# Patient Record
Sex: Male | Born: 1950 | Race: White | Hispanic: No | Smoking: Never smoker
Health system: Southern US, Community
[De-identification: ages and names within clinical notes are randomized; demographics above are authoritative.]

## PROBLEM LIST (undated history)

## (undated) DIAGNOSIS — M1990 Unspecified osteoarthritis, unspecified site: Secondary | ICD-10-CM

## (undated) DIAGNOSIS — K219 Gastro-esophageal reflux disease without esophagitis: Secondary | ICD-10-CM

## (undated) DIAGNOSIS — E785 Hyperlipidemia, unspecified: Secondary | ICD-10-CM

## (undated) DIAGNOSIS — N189 Chronic kidney disease, unspecified: Secondary | ICD-10-CM

## (undated) DIAGNOSIS — I639 Cerebral infarction, unspecified: Secondary | ICD-10-CM

## (undated) DIAGNOSIS — E119 Type 2 diabetes mellitus without complications: Secondary | ICD-10-CM

## (undated) DIAGNOSIS — I10 Essential (primary) hypertension: Secondary | ICD-10-CM

## (undated) DIAGNOSIS — I2510 Atherosclerotic heart disease of native coronary artery without angina pectoris: Secondary | ICD-10-CM

## (undated) DIAGNOSIS — C801 Malignant (primary) neoplasm, unspecified: Secondary | ICD-10-CM

## (undated) HISTORY — PX: CYSTECTOMY: SUR359

## (undated) HISTORY — DX: Cerebral infarction, unspecified: I63.9

## (undated) HISTORY — PX: MOUTH SURGERY: SHX715

## (undated) HISTORY — DX: Malignant (primary) neoplasm, unspecified: C80.1

## (undated) HISTORY — DX: Essential (primary) hypertension: I10

## (undated) HISTORY — PX: ADENOIDECTOMY: SUR15

## (undated) HISTORY — PX: BICEPS TENDON REPAIR: SHX566

## (undated) HISTORY — PX: HERNIA REPAIR: SHX51

## (undated) HISTORY — DX: Hyperlipidemia, unspecified: E78.5

## (undated) HISTORY — DX: Type 2 diabetes mellitus without complications: E11.9

## (undated) HISTORY — PX: TONSILLECTOMY: SUR1361

## (undated) HISTORY — PX: SHOULDER ARTHROSCOPY: SHX128

## (undated) HISTORY — PX: KNEE ARTHROSCOPY: SUR90

## (undated) HISTORY — PX: ROTATOR CUFF REPAIR: SHX139

## (undated) HISTORY — DX: Atherosclerotic heart disease of native coronary artery without angina pectoris: I25.10

## (undated) HISTORY — DX: Unspecified osteoarthritis, unspecified site: M19.90

## (undated) HISTORY — DX: Gastro-esophageal reflux disease without esophagitis: K21.9

## (undated) HISTORY — PX: VASECTOMY: SHX75

## (undated) HISTORY — DX: Chronic kidney disease, unspecified: N18.9

---

## 2015-08-20 DIAGNOSIS — M47816 Spondylosis without myelopathy or radiculopathy, lumbar region: Secondary | ICD-10-CM | POA: Diagnosis not present

## 2015-08-20 DIAGNOSIS — Z79899 Other long term (current) drug therapy: Secondary | ICD-10-CM | POA: Diagnosis not present

## 2015-08-20 DIAGNOSIS — Z01818 Encounter for other preprocedural examination: Secondary | ICD-10-CM | POA: Diagnosis not present

## 2015-08-20 DIAGNOSIS — M4316 Spondylolisthesis, lumbar region: Secondary | ICD-10-CM | POA: Diagnosis not present

## 2015-08-20 DIAGNOSIS — M79609 Pain in unspecified limb: Secondary | ICD-10-CM | POA: Diagnosis not present

## 2015-08-20 DIAGNOSIS — Z01812 Encounter for preprocedural laboratory examination: Secondary | ICD-10-CM | POA: Diagnosis not present

## 2015-08-20 DIAGNOSIS — Z01810 Encounter for preprocedural cardiovascular examination: Secondary | ICD-10-CM | POA: Diagnosis not present

## 2015-08-20 DIAGNOSIS — M47896 Other spondylosis, lumbar region: Secondary | ICD-10-CM | POA: Diagnosis not present

## 2015-08-24 DIAGNOSIS — E119 Type 2 diabetes mellitus without complications: Secondary | ICD-10-CM | POA: Diagnosis not present

## 2015-08-25 DIAGNOSIS — M4316 Spondylolisthesis, lumbar region: Secondary | ICD-10-CM | POA: Diagnosis not present

## 2015-08-25 DIAGNOSIS — M5127 Other intervertebral disc displacement, lumbosacral region: Secondary | ICD-10-CM | POA: Diagnosis not present

## 2015-08-27 DIAGNOSIS — E1143 Type 2 diabetes mellitus with diabetic autonomic (poly)neuropathy: Secondary | ICD-10-CM | POA: Diagnosis not present

## 2015-08-27 DIAGNOSIS — M519 Unspecified thoracic, thoracolumbar and lumbosacral intervertebral disc disorder: Secondary | ICD-10-CM | POA: Diagnosis not present

## 2015-08-27 DIAGNOSIS — Z23 Encounter for immunization: Secondary | ICD-10-CM | POA: Diagnosis not present

## 2015-09-03 DIAGNOSIS — E785 Hyperlipidemia, unspecified: Secondary | ICD-10-CM | POA: Insufficient documentation

## 2015-09-03 DIAGNOSIS — I2510 Atherosclerotic heart disease of native coronary artery without angina pectoris: Secondary | ICD-10-CM | POA: Diagnosis not present

## 2015-09-03 DIAGNOSIS — I10 Essential (primary) hypertension: Secondary | ICD-10-CM | POA: Insufficient documentation

## 2015-09-03 DIAGNOSIS — E119 Type 2 diabetes mellitus without complications: Secondary | ICD-10-CM | POA: Diagnosis not present

## 2015-09-10 DIAGNOSIS — M549 Dorsalgia, unspecified: Secondary | ICD-10-CM | POA: Diagnosis not present

## 2015-10-02 DIAGNOSIS — M5431 Sciatica, right side: Secondary | ICD-10-CM | POA: Diagnosis not present

## 2015-10-02 DIAGNOSIS — M4316 Spondylolisthesis, lumbar region: Secondary | ICD-10-CM | POA: Diagnosis not present

## 2015-10-02 DIAGNOSIS — M4726 Other spondylosis with radiculopathy, lumbar region: Secondary | ICD-10-CM | POA: Diagnosis not present

## 2015-10-02 DIAGNOSIS — M4806 Spinal stenosis, lumbar region: Secondary | ICD-10-CM | POA: Diagnosis not present

## 2015-10-29 DIAGNOSIS — S161XXA Strain of muscle, fascia and tendon at neck level, initial encounter: Secondary | ICD-10-CM | POA: Diagnosis not present

## 2015-10-29 DIAGNOSIS — R69 Illness, unspecified: Secondary | ICD-10-CM | POA: Diagnosis not present

## 2015-10-29 DIAGNOSIS — I2510 Atherosclerotic heart disease of native coronary artery without angina pectoris: Secondary | ICD-10-CM | POA: Diagnosis not present

## 2015-10-29 DIAGNOSIS — M542 Cervicalgia: Secondary | ICD-10-CM | POA: Diagnosis not present

## 2015-10-29 DIAGNOSIS — I10 Essential (primary) hypertension: Secondary | ICD-10-CM | POA: Diagnosis not present

## 2015-10-29 DIAGNOSIS — K219 Gastro-esophageal reflux disease without esophagitis: Secondary | ICD-10-CM | POA: Diagnosis not present

## 2015-10-29 DIAGNOSIS — R51 Headache: Secondary | ICD-10-CM | POA: Diagnosis not present

## 2015-10-29 DIAGNOSIS — S199XXA Unspecified injury of neck, initial encounter: Secondary | ICD-10-CM | POA: Diagnosis not present

## 2015-10-29 DIAGNOSIS — D649 Anemia, unspecified: Secondary | ICD-10-CM | POA: Diagnosis not present

## 2015-10-29 DIAGNOSIS — E7800 Pure hypercholesterolemia, unspecified: Secondary | ICD-10-CM | POA: Diagnosis not present

## 2015-10-29 DIAGNOSIS — E119 Type 2 diabetes mellitus without complications: Secondary | ICD-10-CM | POA: Diagnosis not present

## 2015-10-29 DIAGNOSIS — S060X0A Concussion without loss of consciousness, initial encounter: Secondary | ICD-10-CM | POA: Diagnosis not present

## 2015-11-16 DIAGNOSIS — E119 Type 2 diabetes mellitus without complications: Secondary | ICD-10-CM | POA: Diagnosis not present

## 2015-11-24 DIAGNOSIS — E1143 Type 2 diabetes mellitus with diabetic autonomic (poly)neuropathy: Secondary | ICD-10-CM | POA: Diagnosis not present

## 2015-11-24 DIAGNOSIS — E1165 Type 2 diabetes mellitus with hyperglycemia: Secondary | ICD-10-CM | POA: Diagnosis not present

## 2015-12-10 DIAGNOSIS — I10 Essential (primary) hypertension: Secondary | ICD-10-CM | POA: Diagnosis not present

## 2015-12-10 DIAGNOSIS — I2510 Atherosclerotic heart disease of native coronary artery without angina pectoris: Secondary | ICD-10-CM | POA: Diagnosis not present

## 2015-12-14 DIAGNOSIS — I129 Hypertensive chronic kidney disease with stage 1 through stage 4 chronic kidney disease, or unspecified chronic kidney disease: Secondary | ICD-10-CM | POA: Diagnosis not present

## 2015-12-14 DIAGNOSIS — D649 Anemia, unspecified: Secondary | ICD-10-CM | POA: Diagnosis not present

## 2015-12-14 DIAGNOSIS — N183 Chronic kidney disease, stage 3 (moderate): Secondary | ICD-10-CM | POA: Diagnosis not present

## 2015-12-14 DIAGNOSIS — E1122 Type 2 diabetes mellitus with diabetic chronic kidney disease: Secondary | ICD-10-CM | POA: Diagnosis not present

## 2015-12-18 DIAGNOSIS — E1122 Type 2 diabetes mellitus with diabetic chronic kidney disease: Secondary | ICD-10-CM | POA: Diagnosis not present

## 2015-12-18 DIAGNOSIS — D649 Anemia, unspecified: Secondary | ICD-10-CM | POA: Diagnosis not present

## 2015-12-18 DIAGNOSIS — N183 Chronic kidney disease, stage 3 (moderate): Secondary | ICD-10-CM | POA: Diagnosis not present

## 2015-12-18 DIAGNOSIS — I129 Hypertensive chronic kidney disease with stage 1 through stage 4 chronic kidney disease, or unspecified chronic kidney disease: Secondary | ICD-10-CM | POA: Diagnosis not present

## 2016-02-02 DIAGNOSIS — H5203 Hypermetropia, bilateral: Secondary | ICD-10-CM | POA: Diagnosis not present

## 2016-02-02 DIAGNOSIS — E119 Type 2 diabetes mellitus without complications: Secondary | ICD-10-CM | POA: Diagnosis not present

## 2016-02-02 DIAGNOSIS — H52209 Unspecified astigmatism, unspecified eye: Secondary | ICD-10-CM | POA: Diagnosis not present

## 2016-02-02 DIAGNOSIS — H524 Presbyopia: Secondary | ICD-10-CM | POA: Diagnosis not present

## 2016-02-02 DIAGNOSIS — Z0100 Encounter for examination of eyes and vision without abnormal findings: Secondary | ICD-10-CM | POA: Diagnosis not present

## 2016-02-17 DIAGNOSIS — D649 Anemia, unspecified: Secondary | ICD-10-CM | POA: Diagnosis not present

## 2016-02-17 DIAGNOSIS — N183 Chronic kidney disease, stage 3 (moderate): Secondary | ICD-10-CM | POA: Diagnosis not present

## 2016-02-17 DIAGNOSIS — I129 Hypertensive chronic kidney disease with stage 1 through stage 4 chronic kidney disease, or unspecified chronic kidney disease: Secondary | ICD-10-CM | POA: Diagnosis not present

## 2016-02-17 DIAGNOSIS — E1122 Type 2 diabetes mellitus with diabetic chronic kidney disease: Secondary | ICD-10-CM | POA: Diagnosis not present

## 2016-02-29 DIAGNOSIS — E1143 Type 2 diabetes mellitus with diabetic autonomic (poly)neuropathy: Secondary | ICD-10-CM | POA: Diagnosis not present

## 2016-02-29 DIAGNOSIS — Z9181 History of falling: Secondary | ICD-10-CM | POA: Diagnosis not present

## 2016-02-29 DIAGNOSIS — Z1389 Encounter for screening for other disorder: Secondary | ICD-10-CM | POA: Diagnosis not present

## 2016-02-29 DIAGNOSIS — R69 Illness, unspecified: Secondary | ICD-10-CM | POA: Diagnosis not present

## 2016-02-29 DIAGNOSIS — Z23 Encounter for immunization: Secondary | ICD-10-CM | POA: Diagnosis not present

## 2016-02-29 DIAGNOSIS — M519 Unspecified thoracic, thoracolumbar and lumbosacral intervertebral disc disorder: Secondary | ICD-10-CM | POA: Diagnosis not present

## 2016-02-29 DIAGNOSIS — D510 Vitamin B12 deficiency anemia due to intrinsic factor deficiency: Secondary | ICD-10-CM | POA: Diagnosis not present

## 2016-02-29 DIAGNOSIS — R5383 Other fatigue: Secondary | ICD-10-CM | POA: Diagnosis not present

## 2016-02-29 DIAGNOSIS — Z0000 Encounter for general adult medical examination without abnormal findings: Secondary | ICD-10-CM | POA: Diagnosis not present

## 2016-04-22 DIAGNOSIS — E1143 Type 2 diabetes mellitus with diabetic autonomic (poly)neuropathy: Secondary | ICD-10-CM | POA: Diagnosis not present

## 2016-05-25 DIAGNOSIS — Z794 Long term (current) use of insulin: Secondary | ICD-10-CM | POA: Diagnosis not present

## 2016-05-25 DIAGNOSIS — E7800 Pure hypercholesterolemia, unspecified: Secondary | ICD-10-CM | POA: Diagnosis not present

## 2016-05-25 DIAGNOSIS — K219 Gastro-esophageal reflux disease without esophagitis: Secondary | ICD-10-CM | POA: Diagnosis not present

## 2016-05-25 DIAGNOSIS — J4520 Mild intermittent asthma, uncomplicated: Secondary | ICD-10-CM | POA: Diagnosis not present

## 2016-05-25 DIAGNOSIS — E1143 Type 2 diabetes mellitus with diabetic autonomic (poly)neuropathy: Secondary | ICD-10-CM | POA: Diagnosis not present

## 2016-05-25 DIAGNOSIS — Z0000 Encounter for general adult medical examination without abnormal findings: Secondary | ICD-10-CM | POA: Diagnosis not present

## 2016-05-25 DIAGNOSIS — I69354 Hemiplegia and hemiparesis following cerebral infarction affecting left non-dominant side: Secondary | ICD-10-CM | POA: Diagnosis not present

## 2016-05-25 DIAGNOSIS — R69 Illness, unspecified: Secondary | ICD-10-CM | POA: Diagnosis not present

## 2016-05-25 DIAGNOSIS — I10 Essential (primary) hypertension: Secondary | ICD-10-CM | POA: Diagnosis not present

## 2016-06-29 DIAGNOSIS — H60509 Unspecified acute noninfective otitis externa, unspecified ear: Secondary | ICD-10-CM | POA: Diagnosis not present

## 2016-06-29 DIAGNOSIS — E1165 Type 2 diabetes mellitus with hyperglycemia: Secondary | ICD-10-CM | POA: Diagnosis not present

## 2016-07-11 DIAGNOSIS — D649 Anemia, unspecified: Secondary | ICD-10-CM | POA: Diagnosis not present

## 2016-07-11 DIAGNOSIS — I129 Hypertensive chronic kidney disease with stage 1 through stage 4 chronic kidney disease, or unspecified chronic kidney disease: Secondary | ICD-10-CM | POA: Diagnosis not present

## 2016-07-11 DIAGNOSIS — N183 Chronic kidney disease, stage 3 (moderate): Secondary | ICD-10-CM | POA: Diagnosis not present

## 2016-07-11 DIAGNOSIS — E1122 Type 2 diabetes mellitus with diabetic chronic kidney disease: Secondary | ICD-10-CM | POA: Diagnosis not present

## 2016-07-26 DIAGNOSIS — Z794 Long term (current) use of insulin: Secondary | ICD-10-CM | POA: Diagnosis not present

## 2016-07-26 DIAGNOSIS — E785 Hyperlipidemia, unspecified: Secondary | ICD-10-CM | POA: Diagnosis not present

## 2016-07-26 DIAGNOSIS — I2510 Atherosclerotic heart disease of native coronary artery without angina pectoris: Secondary | ICD-10-CM | POA: Diagnosis not present

## 2016-07-26 DIAGNOSIS — I10 Essential (primary) hypertension: Secondary | ICD-10-CM | POA: Diagnosis not present

## 2016-07-26 DIAGNOSIS — E119 Type 2 diabetes mellitus without complications: Secondary | ICD-10-CM | POA: Diagnosis not present

## 2016-08-31 DIAGNOSIS — Z794 Long term (current) use of insulin: Secondary | ICD-10-CM | POA: Diagnosis not present

## 2016-08-31 DIAGNOSIS — I209 Angina pectoris, unspecified: Secondary | ICD-10-CM | POA: Diagnosis not present

## 2016-08-31 DIAGNOSIS — E785 Hyperlipidemia, unspecified: Secondary | ICD-10-CM | POA: Diagnosis not present

## 2016-08-31 DIAGNOSIS — I2510 Atherosclerotic heart disease of native coronary artery without angina pectoris: Secondary | ICD-10-CM | POA: Diagnosis not present

## 2016-08-31 DIAGNOSIS — I10 Essential (primary) hypertension: Secondary | ICD-10-CM | POA: Diagnosis not present

## 2016-08-31 DIAGNOSIS — E119 Type 2 diabetes mellitus without complications: Secondary | ICD-10-CM | POA: Diagnosis not present

## 2016-09-16 DIAGNOSIS — R69 Illness, unspecified: Secondary | ICD-10-CM | POA: Diagnosis not present

## 2016-09-28 DIAGNOSIS — E1165 Type 2 diabetes mellitus with hyperglycemia: Secondary | ICD-10-CM | POA: Diagnosis not present

## 2016-10-03 DIAGNOSIS — R062 Wheezing: Secondary | ICD-10-CM | POA: Diagnosis not present

## 2016-10-03 DIAGNOSIS — J00 Acute nasopharyngitis [common cold]: Secondary | ICD-10-CM | POA: Diagnosis not present

## 2016-10-03 DIAGNOSIS — Z23 Encounter for immunization: Secondary | ICD-10-CM | POA: Diagnosis not present

## 2016-10-03 DIAGNOSIS — E1165 Type 2 diabetes mellitus with hyperglycemia: Secondary | ICD-10-CM | POA: Diagnosis not present

## 2016-10-05 DIAGNOSIS — R062 Wheezing: Secondary | ICD-10-CM | POA: Diagnosis not present

## 2016-11-15 DIAGNOSIS — R1310 Dysphagia, unspecified: Secondary | ICD-10-CM | POA: Diagnosis not present

## 2016-12-01 DIAGNOSIS — E119 Type 2 diabetes mellitus without complications: Secondary | ICD-10-CM | POA: Diagnosis not present

## 2016-12-01 DIAGNOSIS — E785 Hyperlipidemia, unspecified: Secondary | ICD-10-CM | POA: Diagnosis not present

## 2016-12-01 DIAGNOSIS — I10 Essential (primary) hypertension: Secondary | ICD-10-CM | POA: Diagnosis not present

## 2016-12-01 DIAGNOSIS — Z794 Long term (current) use of insulin: Secondary | ICD-10-CM | POA: Diagnosis not present

## 2016-12-01 DIAGNOSIS — I2510 Atherosclerotic heart disease of native coronary artery without angina pectoris: Secondary | ICD-10-CM | POA: Diagnosis not present

## 2016-12-26 DIAGNOSIS — E1165 Type 2 diabetes mellitus with hyperglycemia: Secondary | ICD-10-CM | POA: Diagnosis not present

## 2017-01-02 DIAGNOSIS — E1165 Type 2 diabetes mellitus with hyperglycemia: Secondary | ICD-10-CM | POA: Diagnosis not present

## 2017-01-06 DIAGNOSIS — R69 Illness, unspecified: Secondary | ICD-10-CM | POA: Diagnosis not present

## 2017-01-24 DIAGNOSIS — M130 Polyarthritis, unspecified: Secondary | ICD-10-CM | POA: Diagnosis not present

## 2017-01-24 DIAGNOSIS — Z7982 Long term (current) use of aspirin: Secondary | ICD-10-CM | POA: Diagnosis not present

## 2017-01-24 DIAGNOSIS — I10 Essential (primary) hypertension: Secondary | ICD-10-CM | POA: Diagnosis not present

## 2017-01-24 DIAGNOSIS — H9193 Unspecified hearing loss, bilateral: Secondary | ICD-10-CM | POA: Diagnosis not present

## 2017-01-24 DIAGNOSIS — Z8673 Personal history of transient ischemic attack (TIA), and cerebral infarction without residual deficits: Secondary | ICD-10-CM | POA: Diagnosis not present

## 2017-01-24 DIAGNOSIS — M6208 Separation of muscle (nontraumatic), other site: Secondary | ICD-10-CM | POA: Diagnosis not present

## 2017-01-24 DIAGNOSIS — E785 Hyperlipidemia, unspecified: Secondary | ICD-10-CM | POA: Diagnosis not present

## 2017-01-24 DIAGNOSIS — I209 Angina pectoris, unspecified: Secondary | ICD-10-CM | POA: Diagnosis not present

## 2017-01-24 DIAGNOSIS — K219 Gastro-esophageal reflux disease without esophagitis: Secondary | ICD-10-CM | POA: Diagnosis not present

## 2017-01-24 DIAGNOSIS — G4700 Insomnia, unspecified: Secondary | ICD-10-CM | POA: Diagnosis not present

## 2017-01-24 DIAGNOSIS — Z7984 Long term (current) use of oral hypoglycemic drugs: Secondary | ICD-10-CM | POA: Diagnosis not present

## 2017-01-24 DIAGNOSIS — Z87891 Personal history of nicotine dependence: Secondary | ICD-10-CM | POA: Diagnosis not present

## 2017-01-24 DIAGNOSIS — E669 Obesity, unspecified: Secondary | ICD-10-CM | POA: Diagnosis not present

## 2017-01-24 DIAGNOSIS — Z6836 Body mass index (BMI) 36.0-36.9, adult: Secondary | ICD-10-CM | POA: Diagnosis not present

## 2017-01-24 DIAGNOSIS — E1141 Type 2 diabetes mellitus with diabetic mononeuropathy: Secondary | ICD-10-CM | POA: Diagnosis not present

## 2017-01-24 DIAGNOSIS — Z0000 Encounter for general adult medical examination without abnormal findings: Secondary | ICD-10-CM | POA: Diagnosis not present

## 2017-02-14 DIAGNOSIS — E1122 Type 2 diabetes mellitus with diabetic chronic kidney disease: Secondary | ICD-10-CM | POA: Diagnosis not present

## 2017-02-14 DIAGNOSIS — D649 Anemia, unspecified: Secondary | ICD-10-CM | POA: Diagnosis not present

## 2017-02-14 DIAGNOSIS — I129 Hypertensive chronic kidney disease with stage 1 through stage 4 chronic kidney disease, or unspecified chronic kidney disease: Secondary | ICD-10-CM | POA: Diagnosis not present

## 2017-02-14 DIAGNOSIS — N183 Chronic kidney disease, stage 3 (moderate): Secondary | ICD-10-CM | POA: Diagnosis not present

## 2017-02-28 DIAGNOSIS — I10 Essential (primary) hypertension: Secondary | ICD-10-CM | POA: Diagnosis not present

## 2017-02-28 DIAGNOSIS — E119 Type 2 diabetes mellitus without complications: Secondary | ICD-10-CM | POA: Diagnosis not present

## 2017-02-28 DIAGNOSIS — I209 Angina pectoris, unspecified: Secondary | ICD-10-CM | POA: Diagnosis not present

## 2017-02-28 DIAGNOSIS — I2510 Atherosclerotic heart disease of native coronary artery without angina pectoris: Secondary | ICD-10-CM | POA: Diagnosis not present

## 2017-02-28 DIAGNOSIS — E785 Hyperlipidemia, unspecified: Secondary | ICD-10-CM | POA: Diagnosis not present

## 2017-03-25 DIAGNOSIS — E119 Type 2 diabetes mellitus without complications: Secondary | ICD-10-CM | POA: Diagnosis not present

## 2017-03-25 DIAGNOSIS — H524 Presbyopia: Secondary | ICD-10-CM | POA: Diagnosis not present

## 2017-03-25 DIAGNOSIS — H52209 Unspecified astigmatism, unspecified eye: Secondary | ICD-10-CM | POA: Diagnosis not present

## 2017-03-25 DIAGNOSIS — H5203 Hypermetropia, bilateral: Secondary | ICD-10-CM | POA: Diagnosis not present

## 2017-04-19 DIAGNOSIS — Z79899 Other long term (current) drug therapy: Secondary | ICD-10-CM | POA: Diagnosis not present

## 2017-04-19 DIAGNOSIS — D519 Vitamin B12 deficiency anemia, unspecified: Secondary | ICD-10-CM | POA: Diagnosis not present

## 2017-04-19 DIAGNOSIS — E1165 Type 2 diabetes mellitus with hyperglycemia: Secondary | ICD-10-CM | POA: Diagnosis not present

## 2017-04-19 DIAGNOSIS — Z0000 Encounter for general adult medical examination without abnormal findings: Secondary | ICD-10-CM | POA: Diagnosis not present

## 2017-04-19 DIAGNOSIS — Z9181 History of falling: Secondary | ICD-10-CM | POA: Diagnosis not present

## 2017-04-19 DIAGNOSIS — Z1389 Encounter for screening for other disorder: Secondary | ICD-10-CM | POA: Diagnosis not present

## 2017-04-19 DIAGNOSIS — Z6832 Body mass index (BMI) 32.0-32.9, adult: Secondary | ICD-10-CM | POA: Diagnosis not present

## 2017-04-24 DIAGNOSIS — R69 Illness, unspecified: Secondary | ICD-10-CM | POA: Diagnosis not present

## 2017-04-25 DIAGNOSIS — R69 Illness, unspecified: Secondary | ICD-10-CM | POA: Diagnosis not present

## 2017-05-10 DIAGNOSIS — M7742 Metatarsalgia, left foot: Secondary | ICD-10-CM | POA: Diagnosis not present

## 2017-05-10 DIAGNOSIS — E1142 Type 2 diabetes mellitus with diabetic polyneuropathy: Secondary | ICD-10-CM | POA: Insufficient documentation

## 2017-05-10 DIAGNOSIS — M7741 Metatarsalgia, right foot: Secondary | ICD-10-CM | POA: Diagnosis not present

## 2017-07-21 DIAGNOSIS — R69 Illness, unspecified: Secondary | ICD-10-CM | POA: Diagnosis not present

## 2017-08-07 DIAGNOSIS — Z23 Encounter for immunization: Secondary | ICD-10-CM | POA: Diagnosis not present

## 2017-08-07 DIAGNOSIS — N183 Chronic kidney disease, stage 3 (moderate): Secondary | ICD-10-CM | POA: Diagnosis not present

## 2017-08-07 DIAGNOSIS — Z6833 Body mass index (BMI) 33.0-33.9, adult: Secondary | ICD-10-CM | POA: Diagnosis not present

## 2017-08-07 DIAGNOSIS — D649 Anemia, unspecified: Secondary | ICD-10-CM | POA: Diagnosis not present

## 2017-08-07 DIAGNOSIS — I129 Hypertensive chronic kidney disease with stage 1 through stage 4 chronic kidney disease, or unspecified chronic kidney disease: Secondary | ICD-10-CM | POA: Diagnosis not present

## 2017-08-07 DIAGNOSIS — E1122 Type 2 diabetes mellitus with diabetic chronic kidney disease: Secondary | ICD-10-CM | POA: Diagnosis not present

## 2017-08-09 ENCOUNTER — Encounter: Payer: Self-pay | Admitting: Cardiology

## 2017-08-09 ENCOUNTER — Ambulatory Visit (INDEPENDENT_AMBULATORY_CARE_PROVIDER_SITE_OTHER): Payer: Medicare HMO | Admitting: Cardiology

## 2017-08-09 VITALS — BP 140/62 | HR 80 | Resp 12 | Ht 68.0 in | Wt 220.8 lb

## 2017-08-09 DIAGNOSIS — I10 Essential (primary) hypertension: Secondary | ICD-10-CM | POA: Diagnosis not present

## 2017-08-09 DIAGNOSIS — E1142 Type 2 diabetes mellitus with diabetic polyneuropathy: Secondary | ICD-10-CM | POA: Diagnosis not present

## 2017-08-09 DIAGNOSIS — I2510 Atherosclerotic heart disease of native coronary artery without angina pectoris: Secondary | ICD-10-CM

## 2017-08-09 DIAGNOSIS — E785 Hyperlipidemia, unspecified: Secondary | ICD-10-CM

## 2017-08-09 DIAGNOSIS — I209 Angina pectoris, unspecified: Secondary | ICD-10-CM

## 2017-08-09 MED ORDER — ISOSORBIDE MONONITRATE ER 30 MG PO TB24: 30.0000 mg | ORAL_TABLET | Freq: Every day | ORAL | 6 refills | Status: DC

## 2017-08-09 NOTE — Patient Instructions (Addendum)
Medication Instructions:  Your physician has recommended you make the following change in your medication:  1.) START taking isosorbide (IMDUR) 30 mg daily.   Labwork: None   Testing/Procedures: EKG today in office.   Follow-Up: Your physician recommends that you schedule a follow-up appointment in: 4 months   Any Other Special Instructions Will Be Listed Below (If Applicable).  Please note that any paperwork needing to be filled out by the provider will need to be addressed at the front desk prior to seeing the provider. Please note that any paperwork FMLA, Disability or other documents regarding health condition is subject to a $25.00 charge that must be received prior to completion of paperwork in the form of a money order or check.    If you need a refill on your cardiac medications before your next appointment, please call your pharmacy.

## 2017-08-09 NOTE — Progress Notes (Signed)
Cardiology Office Note:    Date:  08/09/2017   ID:  Nathan Sloan, DOB 31-Oct-1951, MRN [id number]  PCP:  Angelina Sheriff, MD  Cardiologist:  Jenne Campus, MD    Referring MD: Angelina Sheriff, MD   Chief Complaint  Patient presents with  . Follow-up  I'm doing well  History of Present Illness:    Nathan Sloan is a 66 y.o. male  with coronary artery disease. He does have stable angina. Successfully managed with ranolazine but still reported to have rare episodes of chest pain. He said it happens maybe every 2 or 3 weeks. Typically with extreme exertion. Still he is fairly active and maintain activities of daily living with no difficulties. Normal activity does not bring pain  Past Medical History:  Diagnosis Date  . Chronic kidney disease   . Coronary atherosclerosis of native coronary artery   . Diabetes mellitus without complication ([place])   . GERD (gastroesophageal reflux disease)   . Hyperlipidemia   . Hypertension     Past Surgical History:  Procedure Laterality Date  . CYSTECTOMY    . HERNIA REPAIR    . TONSILLECTOMY      Current Medications: Current Meds  Medication Sig  . amLODipine (NORVASC) 10 MG tablet Take 10 mg by mouth daily.  Marland Kitchen aspirin (GOODSENSE ASPIRIN) 325 MG tablet Take 165 mg by mouth daily.  Marland Kitchen atorvastatin (LIPITOR) 10 MG tablet Take 20 mg by mouth daily.  . cloNIDine (CATAPRES) 0.1 MG tablet Take 0.1 mg by mouth 2 (two) times daily.  . Ferrous Sulfate (IRON) 28 MG TABS Take 1 tablet by mouth daily.  Marland Kitchen gabapentin (NEURONTIN) 300 MG capsule Take 300 mg by mouth daily.   Marland Kitchen glimepiride (AMARYL) 4 MG tablet Take 4 mg by mouth. 1 tablet in the morning and 1/2 in the afternoon  . insulin NPH-regular Human (NOVOLIN 70/30) (70-30) 100 UNIT/ML injection   . Multiple Vitamin (MULTIVITAMIN) capsule Take 1 capsule by mouth daily.  . nitroGLYCERIN (NITROSTAT) 0.4 MG SL tablet Place 0.4 mg under the tongue as needed for chest pain.  . Omega-3 Fatty  Acids (FISH OIL) 1000 MG CAPS Take 1,000 mg by mouth 2 (two) times daily.  . pantoprazole (PROTONIX) 40 MG tablet Take 40 mg by mouth 2 (two) times daily.  . ranolazine (RANEXA) 500 MG 12 hr tablet Take 500 mg by mouth 2 (two) times daily.  Marland Kitchen telmisartan-hydrochlorothiazide (MICARDIS HCT) 80-25 MG tablet Take 1 tablet by mouth daily.  . traZODone (DESYREL) 50 MG tablet Take 50 mg by mouth daily.  . vitamin B-12 (CYANOCOBALAMIN) 500 MCG tablet Take 1 tablet by mouth daily.  . vitamin C (ASCORBIC ACID) 500 MG tablet Take 500 mg by mouth daily.     Allergies:   Sulfa antibiotics; Escitalopram oxalate; Hydrochlorothiazide; Ibuprofen; and Lisinopril   Social History   Social History  . Marital status: Married    Spouse name: N/A  . Number of children: N/A  . Years of education: N/A   Social History Main Topics  . Smoking status: Never Smoker  . Smokeless tobacco: Never Used  . Alcohol use No  . Drug use: No  . Sexual activity: Not Asked   Other Topics Concern  . None   Social History Narrative  . None     Family History: The patient's family history includes Breast cancer in his mother and sister; Colon cancer in his sister. ROS:   Please see the history of present illness.  All 14 point review of systems negative except as described per history of present illness  EKGs/Labs/Other Studies Reviewed:      Recent Labs: No results found for requested labs within last 8760 hours.  Recent Lipid Panel No results found for: CHOL, TRIG, HDL, CHOLHDL, VLDL, LDLCALC, LDLDIRECT  Physical Exam:    VS:  BP 140/62   Pulse 80   Resp 12   Ht 5\' 8"  (1.727 m)   Wt 220 lb 12.8 oz (100.2 kg)   BMI 33.57 kg/m     Wt Readings from Last 3 Encounters:  08/09/17 220 lb 12.8 oz (100.2 kg)     GEN:  Well nourished, well developed in no acute distress HEENT: Normal NECK: No JVD; No carotid bruits LYMPHATICS: No lymphadenopathy CARDIAC: RRR, no murmurs, no rubs, no  gallops RESPIRATORY:  Clear to auscultation without rales, wheezing or rhonchi  ABDOMEN: Soft, non-tender, non-distended MUSCULOSKELETAL:  No edema; No deformity  SKIN: Warm and dry LOWER EXTREMITIES: no swelling NEUROLOGIC:  Alert and oriented x 3 PSYCHIATRIC:  Normal affect   ASSESSMENT:    1. Coronary artery disease involving native coronary artery of native heart without angina pectoris   2. Angina pectoris ([place])   3. Essential hypertension   4. Diabetic peripheral neuropathy ([place])   5. Dyslipidemia    PLAN:    In order of problems listed above:  1. Coronary artery disease: Stable angina pectoris. I will give him prescription for Imdur 30 mg daily hoping to complete the suppress the pain he is taking nitroglycerin rarely when necessary a total me frequency increased induration increase he needs to let us know. 2. Angina pectoris: Plan as outlined above. 3. Essential hypertension: Blood pressure well controlled we'll continue present management. 4. Diabetes: Stable he brought his sugars and was excellent. 5. Dyslipidemia: I will call primary care physician to get fasting lipid profile.  Overall Tabari is doing well. We'll do EKG. I see him back in my office in 4-5 months.   Medication Adjustments/Labs and Tests Ordered: Current medicines are reviewed at length with the patient today.  Concerns regarding medicines are outlined above.  No orders of the defined types were placed in this encounter.  Medication changes: No orders of the defined types were placed in this encounter.   Signed, Park Liter, MD, [facility] 08/09/2017 2:39 PM    [place]

## 2017-11-08 DIAGNOSIS — E1143 Type 2 diabetes mellitus with diabetic autonomic (poly)neuropathy: Secondary | ICD-10-CM | POA: Diagnosis not present

## 2017-11-08 DIAGNOSIS — Z6834 Body mass index (BMI) 34.0-34.9, adult: Secondary | ICD-10-CM | POA: Diagnosis not present

## 2017-11-08 DIAGNOSIS — G4700 Insomnia, unspecified: Secondary | ICD-10-CM | POA: Diagnosis not present

## 2017-12-04 DIAGNOSIS — Z23 Encounter for immunization: Secondary | ICD-10-CM | POA: Diagnosis not present

## 2018-01-03 DIAGNOSIS — Z23 Encounter for immunization: Secondary | ICD-10-CM | POA: Diagnosis not present

## 2018-01-15 ENCOUNTER — Telehealth: Payer: Self-pay | Admitting: Cardiology

## 2018-01-15 MED ORDER — NITROGLYCERIN 0.4 MG SL SUBL: 0.4000 mg | SUBLINGUAL_TABLET | SUBLINGUAL | 7 refills | Status: DC | PRN

## 2018-01-15 NOTE — Telephone Encounter (Signed)
Refill sent.

## 2018-01-15 NOTE — Telephone Encounter (Signed)
°*  STAT* If patient is at the pharmacy, call can be transferred to refill team.   1. Which medications need to be refilled? (please list name of each medication and dose if known) Nitroglycerin  2. Which pharmacy/location (including street and city if local pharmacy) is medication to be sent to? [facility] In [place]  3. Do they need a 30 day or 90 day supply? Normal amount   Prescription had expired for Nitro.

## 2018-02-01 DIAGNOSIS — R69 Illness, unspecified: Secondary | ICD-10-CM | POA: Diagnosis not present

## 2018-02-05 DIAGNOSIS — D638 Anemia in other chronic diseases classified elsewhere: Secondary | ICD-10-CM | POA: Diagnosis not present

## 2018-02-05 DIAGNOSIS — Z6833 Body mass index (BMI) 33.0-33.9, adult: Secondary | ICD-10-CM | POA: Diagnosis not present

## 2018-02-05 DIAGNOSIS — E1165 Type 2 diabetes mellitus with hyperglycemia: Secondary | ICD-10-CM | POA: Diagnosis not present

## 2018-02-07 ENCOUNTER — Ambulatory Visit: Payer: Medicare HMO | Admitting: Cardiology

## 2018-02-07 ENCOUNTER — Encounter: Payer: Self-pay | Admitting: *Deleted

## 2018-02-07 ENCOUNTER — Encounter: Payer: Self-pay | Admitting: Cardiology

## 2018-02-07 VITALS — BP 130/60 | HR 55 | Ht 68.0 in | Wt 224.0 lb

## 2018-02-07 DIAGNOSIS — E785 Hyperlipidemia, unspecified: Secondary | ICD-10-CM | POA: Diagnosis not present

## 2018-02-07 DIAGNOSIS — I209 Angina pectoris, unspecified: Secondary | ICD-10-CM | POA: Diagnosis not present

## 2018-02-07 DIAGNOSIS — I2510 Atherosclerotic heart disease of native coronary artery without angina pectoris: Secondary | ICD-10-CM | POA: Diagnosis not present

## 2018-02-07 DIAGNOSIS — I10 Essential (primary) hypertension: Secondary | ICD-10-CM | POA: Diagnosis not present

## 2018-02-07 NOTE — Patient Instructions (Signed)
Medication Instructions:  Your physician recommends that you continue on your current medications as directed. Please refer to the Current Medication list given to you today.  Labwork: None Ordered  Testing/Procedures: Your physician has requested that you have an echocardiogram. Echocardiography is a painless test that uses sound waves to create images of your heart. It provides your doctor with information about the size and shape of your heart and how well your heart's chambers and valves are working. This procedure takes approximately one hour. There are no restrictions for this procedure.  Follow-Up: Your physician recommends that you schedule a follow-up appointment in: 5 months with Dr. Agustin Cree   Any Other Special Instructions Will Be Listed Below (If Applicable).     If you need a refill on your cardiac medications before your next appointment, please call your pharmacy.

## 2018-02-07 NOTE — Progress Notes (Signed)
Cardiology Office Note:    Date:  02/07/2018   ID:  Nathan Sloan, DOB 04-21-1951, MRN [id number]  PCP:  Angelina Sheriff, MD  Cardiologist:  Jenne Campus, MD    Referring MD: Angelina Sheriff, MD   Chief Complaint  Patient presents with  . Follow-up  Well  History of Present Illness:    Nathan Sloan is a 67 y.o. male with coronary artery disease.  Overall he seems to be doing well.  Still very active have no difficulty doing it I gave him last time long-acting nitrates which gave him headache initially but then he got used to it and he thinks it helps a great deal again denies having any chest pain tightness squeezing pressure burning chest.  Monday he did see his primary care physician there was a lot of blood work done.  We will try to retrieve results of it.  Past Medical History:  Diagnosis Date  . Chronic kidney disease   . Coronary atherosclerosis of native coronary artery   . Diabetes mellitus without complication ([place])   . GERD (gastroesophageal reflux disease)   . Hyperlipidemia   . Hypertension     Past Surgical History:  Procedure Laterality Date  . CYSTECTOMY    . HERNIA REPAIR    . TONSILLECTOMY      Current Medications: Current Meds  Medication Sig  . amLODipine (NORVASC) 10 MG tablet Take 10 mg by mouth daily.  Marland Kitchen aspirin (GOODSENSE ASPIRIN) 325 MG tablet Take 165 mg by mouth daily.  Marland Kitchen atorvastatin (LIPITOR) 20 MG tablet Take 20 mg by mouth daily.  . cloNIDine (CATAPRES) 0.1 MG tablet Take 0.1 mg by mouth 2 (two) times daily.  . Ferrous Sulfate (IRON) 28 MG TABS Take 1 tablet by mouth daily.  Marland Kitchen gabapentin (NEURONTIN) 300 MG capsule Take 300 mg by mouth daily.   Marland Kitchen glimepiride (AMARYL) 4 MG tablet Take 4 mg by mouth. 1 tablet in the morning and 1 in the afternoon  . insulin NPH-regular Human (NOVOLIN 70/30) (70-30) 100 UNIT/ML injection   . isosorbide mononitrate (IMDUR) 30 MG 24 hr tablet Take 1 tablet (30 mg total) by mouth daily.  . Multiple  Vitamin (MULTIVITAMIN) capsule Take 1 capsule by mouth daily.  . nitroGLYCERIN (NITROSTAT) 0.4 MG SL tablet Place 1 tablet (0.4 mg total) under the tongue every 5 (five) minutes as needed for chest pain.  . Omega-3 Fatty Acids (FISH OIL) 1000 MG CAPS Take 1,000 mg by mouth 2 (two) times daily.  . pantoprazole (PROTONIX) 40 MG tablet Take 40 mg by mouth 2 (two) times daily.  . ranolazine (RANEXA) 500 MG 12 hr tablet Take 500 mg by mouth 2 (two) times daily.  Marland Kitchen telmisartan-hydrochlorothiazide (MICARDIS HCT) 80-25 MG tablet Take 1 tablet by mouth daily.  . traZODone (DESYREL) 50 MG tablet Take 50 mg by mouth daily.  . vitamin B-12 (CYANOCOBALAMIN) 500 MCG tablet Take 1 tablet by mouth daily.  . vitamin C (ASCORBIC ACID) 500 MG tablet Take 500 mg by mouth daily.     Allergies:   Sulfa antibiotics; Escitalopram oxalate; Hydrochlorothiazide; Ibuprofen; and Lisinopril   Social History   Socioeconomic History  . Marital status: Married    Spouse name: Not on file  . Number of children: Not on file  . Years of education: Not on file  . Highest education level: Not on file  Occupational History  . Not on file  Social Needs  . Financial resource strain: Not on  file  . Food insecurity:    Worry: Not on file    Inability: Not on file  . Transportation needs:    Medical: Not on file    Non-medical: Not on file  Tobacco Use  . Smoking status: Never Smoker  . Smokeless tobacco: Never Used  Substance and Sexual Activity  . Alcohol use: No  . Drug use: No  . Sexual activity: Not on file  Lifestyle  . Physical activity:    Days per week: Not on file    Minutes per session: Not on file  . Stress: Not on file  Relationships  . Social connections:    Talks on phone: Not on file    Gets together: Not on file    Attends religious service: Not on file    Active member of club or organization: Not on file    Attends meetings of clubs or organizations: Not on file    Relationship status: Not  on file  Other Topics Concern  . Not on file  Social History Narrative  . Not on file     Family History: The patient's family history includes Breast cancer in his mother and sister; Colon cancer in his sister. ROS:   Please see the history of present illness.    All 14 point review of systems negative except as described per history of present illness  EKGs/Labs/Other Studies Reviewed:      Recent Labs: No results found for requested labs within last 8760 hours.  Recent Lipid Panel No results found for: CHOL, TRIG, HDL, CHOLHDL, VLDL, LDLCALC, LDLDIRECT  Physical Exam:    VS:  BP 130/60   Pulse (!) 55   Ht 5\' 8"  (1.727 m)   Wt 224 lb (101.6 kg)   SpO2 95%   BMI 34.06 kg/m     Wt Readings from Last 3 Encounters:  02/07/18 224 lb (101.6 kg)  08/09/17 220 lb 12.8 oz (100.2 kg)     GEN:  Well nourished, well developed in no acute distress HEENT: Normal NECK: No JVD; No carotid bruits LYMPHATICS: No lymphadenopathy CARDIAC: RRR, no murmurs, no rubs, no gallops RESPIRATORY:  Clear to auscultation without rales, wheezing or rhonchi  ABDOMEN: Soft, non-tender, non-distended MUSCULOSKELETAL:  No edema; No deformity  SKIN: Warm and dry LOWER EXTREMITIES: Normal swelling NEUROLOGIC:  Alert and oriented x 3 PSYCHIATRIC:  Normal affect   ASSESSMENT:    1. Coronary artery disease involving native coronary artery of native heart without angina pectoris   2. Angina pectoris ([place])   3. Essential hypertension   4. Dyslipidemia    PLAN:    In order of problems listed above:  1. Coronary artery disease: Stable and appropriate medications which I will continue. 2. Angina pectoris: Controlled with medications which I will continue. 3. Essential hypertension doing well from that point of view he brought a book with blood pressure measurements that he does a few times a day and all are looking excellent. 4. Dyslipidemia he is on high intensity statin which I will continue we  will call primary care physician to get fasting lipid profile  He did not the swelling of lower extremities which appears to be worse at evening time and overall.  I will schedule him to have echocardiogram to assess left ventricular ejection fraction.   Medication Adjustments/Labs and Tests Ordered: Current medicines are reviewed at length with the patient today.  Concerns regarding medicines are outlined above.  No orders of the defined types  were placed in this encounter.  Medication changes: No orders of the defined types were placed in this encounter.   Signed, Park Liter, MD, [facility] 02/07/2018 10:26 AM    [place]

## 2018-03-01 DIAGNOSIS — E1122 Type 2 diabetes mellitus with diabetic chronic kidney disease: Secondary | ICD-10-CM | POA: Diagnosis not present

## 2018-03-01 DIAGNOSIS — D649 Anemia, unspecified: Secondary | ICD-10-CM | POA: Diagnosis not present

## 2018-03-01 DIAGNOSIS — Z6833 Body mass index (BMI) 33.0-33.9, adult: Secondary | ICD-10-CM | POA: Diagnosis not present

## 2018-03-01 DIAGNOSIS — I129 Hypertensive chronic kidney disease with stage 1 through stage 4 chronic kidney disease, or unspecified chronic kidney disease: Secondary | ICD-10-CM | POA: Diagnosis not present

## 2018-03-01 DIAGNOSIS — N183 Chronic kidney disease, stage 3 (moderate): Secondary | ICD-10-CM | POA: Diagnosis not present

## 2018-03-05 ENCOUNTER — Other Ambulatory Visit (HOSPITAL_BASED_OUTPATIENT_CLINIC_OR_DEPARTMENT_OTHER): Payer: Medicare HMO

## 2018-03-05 ENCOUNTER — Other Ambulatory Visit: Payer: Self-pay | Admitting: Cardiology

## 2018-03-05 DIAGNOSIS — I2510 Atherosclerotic heart disease of native coronary artery without angina pectoris: Secondary | ICD-10-CM

## 2018-03-05 DIAGNOSIS — I209 Angina pectoris, unspecified: Secondary | ICD-10-CM

## 2018-03-12 ENCOUNTER — Ambulatory Visit (HOSPITAL_BASED_OUTPATIENT_CLINIC_OR_DEPARTMENT_OTHER)
Admission: RE | Admit: 2018-03-12 | Discharge: 2018-03-12 | Disposition: A | Discharge status: Home / Self Care | Payer: Medicare HMO | Source: Ambulatory Visit | Attending: Cardiology | Admitting: Cardiology

## 2018-03-12 DIAGNOSIS — I2510 Atherosclerotic heart disease of native coronary artery without angina pectoris: Secondary | ICD-10-CM

## 2018-03-12 DIAGNOSIS — I10 Essential (primary) hypertension: Secondary | ICD-10-CM

## 2018-03-12 DIAGNOSIS — I25119 Atherosclerotic heart disease of native coronary artery with unspecified angina pectoris: Secondary | ICD-10-CM | POA: Insufficient documentation

## 2018-03-12 DIAGNOSIS — E785 Hyperlipidemia, unspecified: Secondary | ICD-10-CM | POA: Insufficient documentation

## 2018-03-12 DIAGNOSIS — I77810 Thoracic aortic ectasia: Secondary | ICD-10-CM | POA: Insufficient documentation

## 2018-03-12 DIAGNOSIS — I119 Hypertensive heart disease without heart failure: Secondary | ICD-10-CM | POA: Diagnosis not present

## 2018-03-12 DIAGNOSIS — I351 Nonrheumatic aortic (valve) insufficiency: Secondary | ICD-10-CM | POA: Insufficient documentation

## 2018-03-12 DIAGNOSIS — I209 Angina pectoris, unspecified: Secondary | ICD-10-CM | POA: Diagnosis not present

## 2018-03-12 NOTE — Progress Notes (Signed)
Echocardiogram 2D Echocardiogram has been performed.  Nathan Sloan 03/12/2018, 9:34 AM

## 2018-03-20 DIAGNOSIS — Z0100 Encounter for examination of eyes and vision without abnormal findings: Secondary | ICD-10-CM | POA: Diagnosis not present

## 2018-03-20 DIAGNOSIS — H524 Presbyopia: Secondary | ICD-10-CM | POA: Diagnosis not present

## 2018-03-20 DIAGNOSIS — H52209 Unspecified astigmatism, unspecified eye: Secondary | ICD-10-CM | POA: Diagnosis not present

## 2018-03-20 DIAGNOSIS — H5203 Hypermetropia, bilateral: Secondary | ICD-10-CM | POA: Diagnosis not present

## 2018-03-20 DIAGNOSIS — E119 Type 2 diabetes mellitus without complications: Secondary | ICD-10-CM | POA: Diagnosis not present

## 2018-03-21 ENCOUNTER — Telehealth: Payer: Self-pay | Admitting: *Deleted

## 2018-03-21 ENCOUNTER — Encounter: Payer: Self-pay | Admitting: *Deleted

## 2018-03-21 DIAGNOSIS — I7789 Other specified disorders of arteries and arterioles: Secondary | ICD-10-CM | POA: Insufficient documentation

## 2018-03-21 DIAGNOSIS — I10 Essential (primary) hypertension: Secondary | ICD-10-CM

## 2018-03-21 NOTE — Telephone Encounter (Signed)
Patient informed of echocardiogram results. Advised patient of further testing. Patient agreeable to have CT angio chest at [facility]. Patient informed that he would be contacted to schedule this appointment. Informed him of lab work that will need to be done 3-7 days before testing. Patient advised to go to [facility] for lab work. Will send reminder letter to patient via mail. Patient verbalized understanding. No further questions.

## 2018-03-21 NOTE — Telephone Encounter (Signed)
-----   Message from Park Liter, MD sent at 03/21/2018 12:04 PM EDT ----- Echocardiogram shows significant enlargement of the aortic root, ejection fraction was normal.  He will need CT angios of his chest to look at the aorta

## 2018-03-27 NOTE — Telephone Encounter (Signed)
Patient informed that CTA chest is scheduled on Thursday, 03/29/18 at 1:30 at [facility]. Patient advised to check in at the outpatient center at 12:30 so he can have lab work done there before hand versus going to [facility]. Patient verbalized understanding. No further questions. Faxed order form to [facility].

## 2018-03-29 DIAGNOSIS — I712 Thoracic aortic aneurysm, without rupture: Secondary | ICD-10-CM | POA: Diagnosis not present

## 2018-03-29 DIAGNOSIS — I7789 Other specified disorders of arteries and arterioles: Secondary | ICD-10-CM | POA: Diagnosis not present

## 2018-04-10 ENCOUNTER — Telehealth: Payer: Self-pay | Admitting: Cardiology

## 2018-04-10 NOTE — Telephone Encounter (Signed)
Left voicemail informing patient that Dr. Bettina Gavia will review testing and will call back.

## 2018-04-10 NOTE — Telephone Encounter (Signed)
Patient called last Friday for results but has not heard back.

## 2018-04-10 NOTE — Telephone Encounter (Signed)
Patient returned call for results 

## 2018-04-11 ENCOUNTER — Other Ambulatory Visit: Payer: Self-pay

## 2018-04-11 DIAGNOSIS — I7789 Other specified disorders of arteries and arterioles: Secondary | ICD-10-CM

## 2018-04-11 DIAGNOSIS — I2510 Atherosclerotic heart disease of native coronary artery without angina pectoris: Secondary | ICD-10-CM

## 2018-04-11 NOTE — Telephone Encounter (Signed)
Informed patient that we would wait for the CT to be reviewed by Dr. Agustin Cree once he returns. It was briefly reviewed by DOD Dr. Bettina Gavia. Creatinine was 2.00 on 03/29/18, CTA was still completed by [facility]. Explained to patient that BMP will need to be done to follow up. Patient was agreeable to go to [facility] either today or tomorrow.

## 2018-04-12 ENCOUNTER — Telehealth: Payer: Self-pay

## 2018-04-12 DIAGNOSIS — I10 Essential (primary) hypertension: Secondary | ICD-10-CM

## 2018-04-12 DIAGNOSIS — I2510 Atherosclerotic heart disease of native coronary artery without angina pectoris: Secondary | ICD-10-CM

## 2018-04-12 LAB — BASIC METABOLIC PANEL
BUN/Creatinine Ratio: 22 (ref 10–24)
BUN: 37 mg/dL — ABNORMAL HIGH (ref 8–27)
CO2: 23 mmol/L (ref 20–29)
CREATININE: 1.67 mg/dL — AB (ref 0.76–1.27)
Calcium: 9.7 mg/dL (ref 8.6–10.2)
Chloride: 101 mmol/L (ref 96–106)
GFR calc Af Amer: 48 mL/min/{1.73_m2} — ABNORMAL LOW (ref >59)
GFR, EST NON AFRICAN AMERICAN: 41 mL/min/{1.73_m2} — AB (ref >59)
Glucose: 120 mg/dL — ABNORMAL HIGH (ref 65–99)
Potassium: 5.8 mmol/L — ABNORMAL HIGH (ref 3.5–5.2)
SODIUM: 137 mmol/L (ref 134–144)

## 2018-04-12 NOTE — Telephone Encounter (Signed)
Unable to reach patient, contacted daughter per DPR. Advised to hold telmisartan starting today. Advised to go to the [facility] in [place] on Monday for a BMP and to log blood pressures starting today through Monday. Daughter verbalized understanding and will advise the patient. No further questions.

## 2018-04-12 NOTE — Telephone Encounter (Signed)
-----   Message from Richardo Priest, MD sent at 04/12/2018  6:14 AM EDT ----- Hyperkalemia Stop telmisartin Recheck Monday and follow home BP

## 2018-04-16 DIAGNOSIS — I10 Essential (primary) hypertension: Secondary | ICD-10-CM | POA: Diagnosis not present

## 2018-04-16 DIAGNOSIS — I7789 Other specified disorders of arteries and arterioles: Secondary | ICD-10-CM | POA: Diagnosis not present

## 2018-04-16 LAB — BASIC METABOLIC PANEL
BUN/Creatinine Ratio: 16 (ref 10–24)
BUN: 28 mg/dL — ABNORMAL HIGH (ref 8–27)
CALCIUM: 9.4 mg/dL (ref 8.6–10.2)
CO2: 25 mmol/L (ref 20–29)
Chloride: 98 mmol/L (ref 96–106)
Creatinine, Ser: 1.7 mg/dL — ABNORMAL HIGH (ref 0.76–1.27)
GFR, EST AFRICAN AMERICAN: 47 mL/min/{1.73_m2} — AB (ref >59)
GFR, EST NON AFRICAN AMERICAN: 41 mL/min/{1.73_m2} — AB (ref >59)
Glucose: 199 mg/dL — ABNORMAL HIGH (ref 65–99)
POTASSIUM: 5.1 mmol/L (ref 3.5–5.2)
Sodium: 133 mmol/L — ABNORMAL LOW (ref 134–144)

## 2018-04-17 NOTE — Addendum Note (Signed)
Addended by: Austin Miles on: 04/17/2018 11:59 AM   Modules accepted: Orders

## 2018-04-17 NOTE — Telephone Encounter (Signed)
Patient informed of CTA chest results and lab results. Dr. Agustin Cree advised that patient should not start taking telmisartan-hydrochlorothiazide 80-25 mg daily again at this time. Advised patient to wait until we recheck lab work again in 2 weeks (05/01/18) in the new [place] office. Informed patient to continue monitoring blood pressure and to call if he had any questions or concerns. Patient verbalized understanding. No further questions.

## 2018-04-24 DIAGNOSIS — D519 Vitamin B12 deficiency anemia, unspecified: Secondary | ICD-10-CM | POA: Diagnosis not present

## 2018-04-24 DIAGNOSIS — Z1331 Encounter for screening for depression: Secondary | ICD-10-CM | POA: Diagnosis not present

## 2018-04-24 DIAGNOSIS — Z9181 History of falling: Secondary | ICD-10-CM | POA: Diagnosis not present

## 2018-04-24 DIAGNOSIS — Z136 Encounter for screening for cardiovascular disorders: Secondary | ICD-10-CM | POA: Diagnosis not present

## 2018-04-24 DIAGNOSIS — E1165 Type 2 diabetes mellitus with hyperglycemia: Secondary | ICD-10-CM | POA: Diagnosis not present

## 2018-04-24 DIAGNOSIS — Z1339 Encounter for screening examination for other mental health and behavioral disorders: Secondary | ICD-10-CM | POA: Diagnosis not present

## 2018-04-24 DIAGNOSIS — Z0000 Encounter for general adult medical examination without abnormal findings: Secondary | ICD-10-CM | POA: Diagnosis not present

## 2018-04-24 DIAGNOSIS — Z6832 Body mass index (BMI) 32.0-32.9, adult: Secondary | ICD-10-CM | POA: Diagnosis not present

## 2018-05-01 ENCOUNTER — Telehealth: Payer: Self-pay | Admitting: Cardiology

## 2018-05-01 DIAGNOSIS — I10 Essential (primary) hypertension: Secondary | ICD-10-CM | POA: Diagnosis not present

## 2018-05-01 LAB — BASIC METABOLIC PANEL
BUN / CREAT RATIO: 12 (ref 10–24)
BUN: 19 mg/dL (ref 8–27)
CO2: 23 mmol/L (ref 20–29)
Calcium: 9.5 mg/dL (ref 8.6–10.2)
Chloride: 102 mmol/L (ref 96–106)
Creatinine, Ser: 1.59 mg/dL — ABNORMAL HIGH (ref 0.76–1.27)
GFR, EST AFRICAN AMERICAN: 51 mL/min/{1.73_m2} — AB (ref >59)
GFR, EST NON AFRICAN AMERICAN: 44 mL/min/{1.73_m2} — AB (ref >59)
Glucose: 123 mg/dL — ABNORMAL HIGH (ref 65–99)
POTASSIUM: 4.9 mmol/L (ref 3.5–5.2)
Sodium: 140 mmol/L (ref 134–144)

## 2018-05-01 NOTE — Telephone Encounter (Signed)
Wants the nurse to call him in reference to some paper work

## 2018-05-01 NOTE — Telephone Encounter (Signed)
Informed patient that his blood work will result tomorrow and he will be called then.

## 2018-05-02 ENCOUNTER — Other Ambulatory Visit: Payer: Self-pay

## 2018-05-02 DIAGNOSIS — R7989 Other specified abnormal findings of blood chemistry: Secondary | ICD-10-CM

## 2018-05-02 DIAGNOSIS — I10 Essential (primary) hypertension: Secondary | ICD-10-CM

## 2018-05-04 NOTE — Telephone Encounter (Signed)
Please see result note 

## 2018-05-16 DIAGNOSIS — R69 Illness, unspecified: Secondary | ICD-10-CM | POA: Diagnosis not present

## 2018-05-17 DIAGNOSIS — R69 Illness, unspecified: Secondary | ICD-10-CM | POA: Diagnosis not present

## 2018-05-28 ENCOUNTER — Other Ambulatory Visit: Payer: Self-pay | Admitting: Cardiology

## 2018-05-28 DIAGNOSIS — I10 Essential (primary) hypertension: Secondary | ICD-10-CM | POA: Diagnosis not present

## 2018-05-28 DIAGNOSIS — R7989 Other specified abnormal findings of blood chemistry: Secondary | ICD-10-CM | POA: Diagnosis not present

## 2018-05-29 ENCOUNTER — Telehealth: Payer: Self-pay

## 2018-05-29 LAB — BASIC METABOLIC PANEL
BUN/Creatinine Ratio: 13 (ref 10–24)
BUN: 20 mg/dL (ref 8–27)
CO2: 23 mmol/L (ref 20–29)
CREATININE: 1.59 mg/dL — AB (ref 0.76–1.27)
Calcium: 9.3 mg/dL (ref 8.6–10.2)
Chloride: 100 mmol/L (ref 96–106)
GFR calc Af Amer: 51 mL/min/{1.73_m2} — ABNORMAL LOW (ref >59)
GFR calc non Af Amer: 44 mL/min/{1.73_m2} — ABNORMAL LOW (ref >59)
GLUCOSE: 129 mg/dL — AB (ref 65–99)
Potassium: 4.2 mmol/L (ref 3.5–5.2)
SODIUM: 139 mmol/L (ref 134–144)

## 2018-05-29 NOTE — Telephone Encounter (Signed)
Left patient detailed voice message regarding results.

## 2018-06-05 DIAGNOSIS — Z23 Encounter for immunization: Secondary | ICD-10-CM | POA: Diagnosis not present

## 2018-08-21 ENCOUNTER — Telehealth: Payer: Self-pay | Admitting: *Deleted

## 2018-08-21 MED ORDER — RANOLAZINE ER 500 MG PO TB12: 500.0000 mg | ORAL_TABLET | Freq: Two times a day (BID) | ORAL | 3 refills | Status: DC

## 2018-08-21 NOTE — Telephone Encounter (Signed)
Renxa 500 mg twice daily refilled.

## 2018-08-21 NOTE — Telephone Encounter (Signed)
Pt needs refill of Ranexa 500 mg. Takes bid and has enough for one more day. [facility] in [place].

## 2018-08-24 ENCOUNTER — Other Ambulatory Visit: Payer: Self-pay

## 2018-08-24 MED ORDER — RANOLAZINE ER 500 MG PO TB12: 500.0000 mg | ORAL_TABLET | Freq: Two times a day (BID) | ORAL | 3 refills | Status: DC

## 2018-08-28 ENCOUNTER — Ambulatory Visit: Payer: Medicare PPO | Admitting: Cardiology

## 2018-08-28 ENCOUNTER — Encounter: Payer: Self-pay | Admitting: Cardiology

## 2018-08-28 VITALS — BP 126/70 | HR 65 | Ht 68.0 in | Wt 199.6 lb

## 2018-08-28 DIAGNOSIS — I7789 Other specified disorders of arteries and arterioles: Secondary | ICD-10-CM | POA: Diagnosis not present

## 2018-08-28 DIAGNOSIS — I2510 Atherosclerotic heart disease of native coronary artery without angina pectoris: Secondary | ICD-10-CM | POA: Diagnosis not present

## 2018-08-28 DIAGNOSIS — I209 Angina pectoris, unspecified: Secondary | ICD-10-CM | POA: Diagnosis not present

## 2018-08-28 DIAGNOSIS — I10 Essential (primary) hypertension: Secondary | ICD-10-CM | POA: Diagnosis not present

## 2018-08-28 DIAGNOSIS — E785 Hyperlipidemia, unspecified: Secondary | ICD-10-CM

## 2018-08-28 NOTE — Progress Notes (Signed)
Cardiology Office Note:    Date:  08/28/2018   ID:  Nathan Sloan, DOB 1951-10-09, MRN [id number]  PCP:  Angelina Sheriff, MD  Cardiologist:  Jenne Campus, MD    Referring MD: Angelina Sheriff, MD   Chief Complaint  Patient presents with  . Follow-up  Doing well  History of Present Illness:    Nathan Sloan is a 67 y.o. male with coronary artery disease diabetes dyslipidemia hypertension also ascending aortic aneurysm measuring last time 4.6 cm by echocardiogram.  4.4 cm by CT done in May.  Also doing well cardiac wise no chest pain tightness squeezing pressure burning chest.  He lost significant amount of weight change in his eating habits he avoids any carbohydrates as well as sweets.  Feels better recently he was put on Jardiance however had issue of headache with the medication.  Denies have any chest pain tightness squeezing pressure burning chest  Past Medical History:  Diagnosis Date  . Chronic kidney disease   . Coronary atherosclerosis of native coronary artery   . Diabetes mellitus without complication ([place])   . GERD (gastroesophageal reflux disease)   . Hyperlipidemia   . Hypertension     Past Surgical History:  Procedure Laterality Date  . CYSTECTOMY    . HERNIA REPAIR    . TONSILLECTOMY      Current Medications: Current Meds  Medication Sig  . aspirin (GOODSENSE ASPIRIN) 325 MG tablet Take 165 mg by mouth daily.  Marland Kitchen atorvastatin (LIPITOR) 20 MG tablet Take 20 mg by mouth daily.  . cloNIDine (CATAPRES) 0.1 MG tablet Take 0.1 mg by mouth 2 (two) times daily.  . empagliflozin (JARDIANCE) 25 MG TABS tablet Take 25 mg by mouth daily.  . Ferrous Sulfate (IRON) 28 MG TABS Take 1 tablet by mouth daily.  Marland Kitchen gabapentin (NEURONTIN) 300 MG capsule Take 300 mg by mouth daily.   . insulin NPH-regular Human (NOVOLIN 70/30) (70-30) 100 UNIT/ML injection   . isosorbide mononitrate (IMDUR) 30 MG 24 hr tablet TAKE 1 TABLET BY MOUTH ONCE DAILY  . Multiple Vitamin  (MULTIVITAMIN) capsule Take 1 capsule by mouth daily.  . nitroGLYCERIN (NITROSTAT) 0.4 MG SL tablet Place 1 tablet (0.4 mg total) under the tongue every 5 (five) minutes as needed for chest pain.  . Omega-3 Fatty Acids (FISH OIL) 1000 MG CAPS Take 1,000 mg by mouth 2 (two) times daily.  . pantoprazole (PROTONIX) 40 MG tablet Take 40 mg by mouth 2 (two) times daily.  . ranolazine (RANEXA) 500 MG 12 hr tablet Take 1 tablet (500 mg total) by mouth 2 (two) times daily.  . traZODone (DESYREL) 50 MG tablet Take 50 mg by mouth daily.  . vitamin B-12 (CYANOCOBALAMIN) 500 MCG tablet Take 1 tablet by mouth daily.  . vitamin C (ASCORBIC ACID) 500 MG tablet Take 500 mg by mouth daily.     Allergies:   Sulfa antibiotics; Escitalopram oxalate; Hydrochlorothiazide; Ibuprofen; and Lisinopril   Social History   Socioeconomic History  . Marital status: Single    Spouse name: Not on file  . Number of children: Not on file  . Years of education: Not on file  . Highest education level: Not on file  Occupational History  . Not on file  Social Needs  . Financial resource strain: Not on file  . Food insecurity:    Worry: Not on file    Inability: Not on file  . Transportation needs:    Medical: Not on file  Non-medical: Not on file  Tobacco Use  . Smoking status: Never Smoker  . Smokeless tobacco: Never Used  Substance and Sexual Activity  . Alcohol use: No  . Drug use: No  . Sexual activity: Not on file  Lifestyle  . Physical activity:    Days per week: Not on file    Minutes per session: Not on file  . Stress: Not on file  Relationships  . Social connections:    Talks on phone: Not on file    Gets together: Not on file    Attends religious service: Not on file    Active member of club or organization: Not on file    Attends meetings of clubs or organizations: Not on file    Relationship status: Not on file  Other Topics Concern  . Not on file  Social History Narrative  . Not on file       Family History: The patient's family history includes Breast cancer in his mother and sister; Colon cancer in his sister. ROS:   Please see the history of present illness.    All 14 point review of systems negative except as described per history of present illness  EKGs/Labs/Other Studies Reviewed:      Recent Labs: 05/28/2018: BUN 20; Creatinine, Ser 1.59; Potassium 4.2; Sodium 139  Recent Lipid Panel No results found for: CHOL, TRIG, HDL, CHOLHDL, VLDL, LDLCALC, LDLDIRECT  Physical Exam:    VS:  BP 126/70   Pulse 65   Ht 5\' 8"  (1.727 m)   Wt 199 lb 9.6 oz (90.5 kg)   SpO2 96%   BMI 30.35 kg/m     Wt Readings from Last 3 Encounters:  08/28/18 199 lb 9.6 oz (90.5 kg)  02/07/18 224 lb (101.6 kg)  08/09/17 220 lb 12.8 oz (100.2 kg)     GEN:  Well nourished, well developed in no acute distress HEENT: Normal NECK: No JVD; No carotid bruits LYMPHATICS: No lymphadenopathy CARDIAC: RRR, no murmurs, no rubs, no gallops RESPIRATORY:  Clear to auscultation without rales, wheezing or rhonchi  ABDOMEN: Soft, non-tender, non-distended MUSCULOSKELETAL:  No edema; No deformity  SKIN: Warm and dry LOWER EXTREMITIES: no swelling NEUROLOGIC:  Alert and oriented x 3 PSYCHIATRIC:  Normal affect   ASSESSMENT:    1. Coronary artery disease involving native coronary artery of native heart without angina pectoris   2. Essential hypertension   3. Angina pectoris ([place])   4. Aortic root enlargement (HCC)   5. Dyslipidemia    PLAN:    In order of problems listed above:  1. Coronary disease stable on appropriate medications which I will continue. 2. Essential hypertension doing well from that point review. 3. Angina pectoris dramatically improved with significant weight loss and changing in diet habits. 4. Aortic root enlargement by echocardiogram 4.6 by CT 4.4 we will continue monitoring.  He will require another CT probably in summer next year 5. Dyslipidemia cholesterol  perfectly controlled on appropriate medication which I will continue.   Medication Adjustments/Labs and Tests Ordered: Current medicines are reviewed at length with the patient today.  Concerns regarding medicines are outlined above.  No orders of the defined types were placed in this encounter.  Medication changes: No orders of the defined types were placed in this encounter.   Signed, Park Liter, MD, [facility] 08/28/2018 4:40 PM    [facility]

## 2018-08-28 NOTE — Patient Instructions (Signed)
Medication Instructions:  Your physician recommends that you continue on your current medications as directed. Please refer to the Current Medication list given to you today.  If you need a refill on your cardiac medications before your next appointment, please call your pharmacy.   Lab work: None.  If you have labs (blood work) drawn today and your tests are completely normal, you will receive your results only by: . MyChart Message (if you have MyChart) OR . A paper copy in the mail If you have any lab test that is abnormal or we need to change your treatment, we will call you to review the results.  Testing/Procedures: None.   Follow-Up: At [facility], you and your health needs are our priority.  As part of our continuing mission to provide you with exceptional heart care, we have created designated Provider Care Teams.  These Care Teams include your primary Cardiologist (physician) and Advanced Practice Providers (APPs -  Physician Assistants and Nurse Practitioners) who all work together to provide you with the care you need, when you need it. You will need a follow up appointment in 3 months.  Please call our office 2 months in advance to schedule this appointment.  You may see No primary care provider on file. or another member of our [facility] Provider Team in [place]: Brian Munley, MD . Rajan Revankar, MD  Any Other Special Instructions Will Be Listed Below (If Applicable).     

## 2018-09-17 DIAGNOSIS — E1129 Type 2 diabetes mellitus with other diabetic kidney complication: Secondary | ICD-10-CM | POA: Diagnosis not present

## 2018-09-17 DIAGNOSIS — Z79899 Other long term (current) drug therapy: Secondary | ICD-10-CM | POA: Diagnosis not present

## 2018-09-17 DIAGNOSIS — E785 Hyperlipidemia, unspecified: Secondary | ICD-10-CM | POA: Diagnosis not present

## 2018-09-24 DIAGNOSIS — I10 Essential (primary) hypertension: Secondary | ICD-10-CM | POA: Diagnosis not present

## 2018-09-24 DIAGNOSIS — E7800 Pure hypercholesterolemia, unspecified: Secondary | ICD-10-CM | POA: Diagnosis not present

## 2018-09-24 DIAGNOSIS — E1143 Type 2 diabetes mellitus with diabetic autonomic (poly)neuropathy: Secondary | ICD-10-CM | POA: Diagnosis not present

## 2018-09-24 DIAGNOSIS — Z6830 Body mass index (BMI) 30.0-30.9, adult: Secondary | ICD-10-CM | POA: Diagnosis not present

## 2018-11-26 DIAGNOSIS — N451 Epididymitis: Secondary | ICD-10-CM | POA: Diagnosis not present

## 2018-11-26 DIAGNOSIS — Z6831 Body mass index (BMI) 31.0-31.9, adult: Secondary | ICD-10-CM | POA: Diagnosis not present

## 2018-12-03 ENCOUNTER — Ambulatory Visit: Payer: Medicare PPO | Admitting: Cardiology

## 2018-12-03 ENCOUNTER — Encounter: Payer: Self-pay | Admitting: Cardiology

## 2018-12-03 VITALS — BP 128/74 | HR 71 | Ht 68.0 in | Wt 206.8 lb

## 2018-12-03 DIAGNOSIS — I209 Angina pectoris, unspecified: Secondary | ICD-10-CM | POA: Diagnosis not present

## 2018-12-03 DIAGNOSIS — I7789 Other specified disorders of arteries and arterioles: Secondary | ICD-10-CM

## 2018-12-03 DIAGNOSIS — E785 Hyperlipidemia, unspecified: Secondary | ICD-10-CM

## 2018-12-03 DIAGNOSIS — I10 Essential (primary) hypertension: Secondary | ICD-10-CM | POA: Diagnosis not present

## 2018-12-03 DIAGNOSIS — I2510 Atherosclerotic heart disease of native coronary artery without angina pectoris: Secondary | ICD-10-CM

## 2018-12-03 NOTE — Progress Notes (Signed)
Cardiology Office Note:    Date:  12/03/2018   ID:  Nathan Sloan, DOB 24-Apr-1951, MRN [id number]  PCP:  Angelina Sheriff, MD  Cardiologist:  Jenne Campus, MD    Referring MD: Angelina Sheriff, MD   Chief Complaint  Patient presents with  . Follow-up  Doing well  History of Present Illness:    Nathan Sloan is a 68 y.o. male with angina, hypertension, aortic root enlargement comes [facility] the [facility] for follow-up overall he is doing well he is relocating from his place to trailer that he is next to his daughter's house.  He is looking forward to it he is looking forward also to be a little more active.  Denies having any chest pain tightness squeezing pressure burning chest does have some minimal swelling of lower extremities at evening time.  His diabetes apparently is controlled.  Past Medical History:  Diagnosis Date  . Chronic kidney disease   . Coronary atherosclerosis of native coronary artery   . Diabetes mellitus without complication ([place])   . GERD (gastroesophageal reflux disease)   . Hyperlipidemia   . Hypertension     Past Surgical History:  Procedure Laterality Date  . CYSTECTOMY    . HERNIA REPAIR    . TONSILLECTOMY      Current Medications: Current Meds  Medication Sig  . aspirin (GOODSENSE ASPIRIN) 325 MG tablet Take 165 mg by mouth daily.  Marland Kitchen atorvastatin (LIPITOR) 20 MG tablet Take 20 mg by mouth daily.  . cloNIDine (CATAPRES) 0.1 MG tablet Take 0.1 mg by mouth 2 (two) times daily.  . Ferrous Sulfate (IRON) 28 MG TABS Take 1 tablet by mouth daily.  Marland Kitchen gabapentin (NEURONTIN) 300 MG capsule Take 300 mg by mouth daily.   Marland Kitchen glimepiride (AMARYL) 4 MG tablet Take 2 mg by mouth 2 (two) times daily.  . insulin NPH-regular Human (NOVOLIN 70/30) (70-30) 100 UNIT/ML injection   . isosorbide mononitrate (IMDUR) 30 MG 24 hr tablet TAKE 1 TABLET BY MOUTH ONCE DAILY  . levofloxacin (LEVAQUIN) 750 MG tablet Take 750 mg by mouth every other day.  . Multiple Vitamin  (MULTIVITAMIN) capsule Take 1 capsule by mouth daily.  . nitroGLYCERIN (NITROSTAT) 0.4 MG SL tablet Place 1 tablet (0.4 mg total) under the tongue every 5 (five) minutes as needed for chest pain.  . Omega-3 Fatty Acids (FISH OIL) 1000 MG CAPS Take 1,000 mg by mouth 2 (two) times daily.  . pantoprazole (PROTONIX) 40 MG tablet Take 40 mg by mouth 2 (two) times daily.  . ranolazine (RANEXA) 500 MG 12 hr tablet Take 1 tablet (500 mg total) by mouth 2 (two) times daily.  . traZODone (DESYREL) 50 MG tablet Take 50 mg by mouth daily.  . vitamin B-12 (CYANOCOBALAMIN) 500 MCG tablet Take 1 tablet by mouth daily.  . vitamin C (ASCORBIC ACID) 500 MG tablet Take 500 mg by mouth daily.     Allergies:   Sulfa antibiotics; Escitalopram oxalate; Hydrochlorothiazide; Ibuprofen; and Lisinopril   Social History   Socioeconomic History  . Marital status: Single    Spouse name: Not on file  . Number of children: Not on file  . Years of education: Not on file  . Highest education level: Not on file  Occupational History  . Not on file  Social Needs  . Financial resource strain: Not on file  . Food insecurity:    Worry: Not on file    Inability: Not on file  . Transportation  needs:    Medical: Not on file    Non-medical: Not on file  Tobacco Use  . Smoking status: Never Smoker  . Smokeless tobacco: Never Used  Substance and Sexual Activity  . Alcohol use: No  . Drug use: No  . Sexual activity: Not on file  Lifestyle  . Physical activity:    Days per week: Not on file    Minutes per session: Not on file  . Stress: Not on file  Relationships  . Social connections:    Talks on phone: Not on file    Gets together: Not on file    Attends religious service: Not on file    Active member of club or organization: Not on file    Attends meetings of clubs or organizations: Not on file    Relationship status: Not on file  Other Topics Concern  . Not on file  Social History Narrative  . Not on file       Family History: The patient's family history includes Breast cancer in his mother and sister; Colon cancer in his sister. ROS:   Please see the history of present illness.    All 14 point review of systems negative except as described per history of present illness  EKGs/Labs/Other Studies Reviewed:      Recent Labs: 05/28/2018: BUN 20; Creatinine, Ser 1.59; Potassium 4.2; Sodium 139  Recent Lipid Panel No results found for: CHOL, TRIG, HDL, CHOLHDL, VLDL, LDLCALC, LDLDIRECT  Physical Exam:    VS:  BP 128/74   Pulse 71   Ht 5\' 8"  (1.727 m)   Wt 206 lb 12.8 oz (93.8 kg)   SpO2 96%   BMI 31.44 kg/m     Wt Readings from Last 3 Encounters:  12/03/18 206 lb 12.8 oz (93.8 kg)  08/28/18 199 lb 9.6 oz (90.5 kg)  02/07/18 224 lb (101.6 kg)     GEN:  Well nourished, well developed in no acute distress HEENT: Normal NECK: No JVD; No carotid bruits LYMPHATICS: No lymphadenopathy CARDIAC: RRR, no murmurs, no rubs, no gallops RESPIRATORY:  Clear to auscultation without rales, wheezing or rhonchi  ABDOMEN: Soft, non-tender, non-distended MUSCULOSKELETAL:  No edema; No deformity  SKIN: Warm and dry LOWER EXTREMITIES: no swelling NEUROLOGIC:  Alert and oriented x 3 PSYCHIATRIC:  Normal affect   ASSESSMENT:    1. Essential hypertension   2. Aortic root enlargement (HCC)   3. Dyslipidemia   4. Angina pectoris ([place])   5. Coronary artery disease involving native coronary artery of native heart without angina pectoris    PLAN:    In order of problems listed above:  1. Essential hypertension blood pressure well controlled continue present management. 2. Aortic root enlargement.  We will schedule him to have CT of his chest which will be done in the summer of this year. 3. Dyslipidemia he is on cholesterol medication Lipitor 20 which I will continue. 4. Angina pectoris denies having any. 5. Coronary artery disease stable. 6. I will schedule him also to have an  echocardiogram which will be done at the end of May.   Medication Adjustments/Labs and Tests Ordered: Current medicines are reviewed at length with the patient today.  Concerns regarding medicines are outlined above.  No orders of the defined types were placed in this encounter.  Medication changes: No orders of the defined types were placed in this encounter.   Signed, Park Liter, MD, [facility] 12/03/2018 [facility] 

## 2018-12-03 NOTE — Patient Instructions (Signed)
Medication Instructions:  Your physician recommends that you continue on your current medications as directed. Please refer to the Current Medication list given to you today.  If you need a refill on your cardiac medications before your next appointment, please call your pharmacy.   Lab work: Your physician recommends that you return for lab work 3-7 days before ct: bmp   Testing/Procedures: Your physician has requested that you have an echocardiogram. Echocardiography is a painless test that uses sound waves to create images of your heart. It provides your doctor with information about the size and shape of your heart and how well your heart's chambers and valves are working. This procedure takes approximately one hour. There are no restrictions for this procedure.  Non-Cardiac CT scanning, (CAT scanning), is a noninvasive, special x-ray that produces cross-sectional images of the body using x-rays and a computer. CT scans help physicians diagnose and treat medical conditions. For some CT exams, a contrast material is used to enhance visibility in the area of the body being studied. CT scans provide greater clarity and reveal more details than regular x-ray exams.    Follow-Up: At [facility], you and your health needs are our priority.  As part of our continuing mission to provide you with exceptional heart care, we have created designated Provider Care Teams.  These Care Teams include your primary Cardiologist (physician) and Advanced Practice Providers (APPs -  Physician Assistants and Nurse Practitioners) who all work together to provide you with the care you need, when you need it. You will need a follow up appointment in 4 months.  Please call our office 2 months in advance to schedule this appointment.  You may see No primary care provider on file. or another member of our [employer] Provider Team in [place]: Shirlee More, MD . Jyl Heinz, MD  Any Other Special Instructions  Will Be Listed Below (If Applicable).   Echocardiogram An echocardiogram is a procedure that uses painless sound waves (ultrasound) to produce an image of the heart. Images from an echocardiogram can provide important information about:  Signs of coronary artery disease (CAD).  Aneurysm detection. An aneurysm is a weak or damaged part of an artery wall that bulges out from the normal force of blood pumping through the body.  Heart size and shape. Changes in the size or shape of the heart can be associated with certain conditions, including heart failure, aneurysm, and CAD.  Heart muscle function.  Heart valve function.  Signs of a past heart attack.  Fluid buildup around the heart.  Thickening of the heart muscle.  A tumor or infectious growth around the heart valves. Tell a health care provider about:  Any allergies you have.  All medicines you are taking, including vitamins, herbs, eye drops, creams, and over-the-counter medicines.  Any blood disorders you have.  Any surgeries you have had.  Any medical conditions you have.  Whether you are pregnant or may be pregnant. What are the risks? Generally, this is a safe procedure. However, problems may occur, including:  Allergic reaction to dye (contrast) that may be used during the procedure. What happens before the procedure? No specific preparation is needed. You may eat and drink normally. What happens during the procedure?   An IV tube may be inserted into one of your veins.  You may receive contrast through this tube. A contrast is an injection that improves the quality of the pictures from your heart.  A gel will be applied to  your chest.  A wand-like tool (transducer) will be moved over your chest. The gel will help to transmit the sound waves from the transducer.  The sound waves will harmlessly bounce off of your heart to allow the heart images to be captured in real-time motion. The images will be recorded  on a computer. The procedure may vary among health care providers and hospitals. What happens after the procedure?  You may return to your normal, everyday life, including diet, activities, and medicines, unless your health care provider tells you not to do that. Summary  An echocardiogram is a procedure that uses painless sound waves (ultrasound) to produce an image of the heart.  Images from an echocardiogram can provide important information about the size and shape of your heart, heart muscle function, heart valve function, and fluid buildup around your heart.  You do not need to do anything to prepare before this procedure. You may eat and drink normally.  After the echocardiogram is completed, you may return to your normal, everyday life, unless your health care provider tells you not to do that. This information is not intended to replace advice given to you by your health care provider. Make sure you discuss any questions you have with your health care provider. Document Released: 10/14/2000 Document Revised: 11/19/2016 Document Reviewed: 11/19/2016 Elsevier Interactive Patient Education  2019 [employer].    CT Angiogram  A CT angiogram is a procedure to look at the blood vessels in various areas of the body. For this procedure, a large X-ray machine, called a CT scanner, takes detailed pictures of blood vessels that have been injected with a dye (contrast material). A CT angiogram allows your health care provider to see how well blood is flowing to the area of your body that is being checked. Your health care provider will be able to see if there are any problems, such as a blockage. Tell a health care provider about:  Any allergies you have.  All medicines you are taking, including vitamins, herbs, eye drops, creams, and over-the-counter medicines.  Any problems you or family members have had with anesthetic medicines.  Any blood disorders you have.  Any surgeries you  have had.  Any medical conditions you have.  Whether you are pregnant or may be pregnant.  Whether you are breastfeeding.  Any anxiety disorders, chronic pain, or other conditions you have that may increase your stress or prevent you from lying still. What are the risks? Generally, this is a safe procedure. However, problems may occur, including:  Infection.  Bleeding.  Allergic reactions to medicines or dyes.  Damage to other structures or organs.  Kidney damage from the dye or contrast that is used.  Increased risk of cancer from radiation exposure. This risk is low. Talk with your health care provider about: ? The risks and benefits of testing. ? How you can receive the lowest dose of radiation. What happens before the procedure?  Wear comfortable clothing and remove any jewelry.  Follow instructions from your health care provider about eating and drinking. For most people, instructions may include these actions: ? For 12 hours before the test, avoid caffeine. This includes tea, coffee, soda, and energy drinks or pills. ? For 3-4 hours before the test, stop eating or drinking anything but water. ? Stay well hydrated by continuing to drink water before the exam. This will help to clear the contrast dye from your body after the test.  Ask your health care provider about  changing or stopping your regular medicines. This is especially important if you are taking diabetes medicines or blood thinners. What happens during the procedure?  An IV tube will be inserted into one of your veins.  You will be asked to lie on an exam table. This table will slide in and out of the CT machine during the procedure.  Contrast dye will be injected into the IV tube. You might feel warm, or you may get a metallic taste in your mouth.  The table that you are lying on will move into the CT machine tunnel for the scan.  The person running the machine will give you instructions while the scans  are being done. You may be asked to: ? Keep your arms above your head. ? Hold your breath. ? Stay very still, even if the table is moving.  When the scanning is complete, you will be moved out of the machine.  The IV tube will be removed. The procedure may vary among health care providers and hospitals. What happens after the procedure?  You might feel warm, or you may get a metallic taste in your mouth.  You may be asked to drink water or other fluids to wash (flush) the contrast material out of your body.  It is up to you to get the results of your procedure. Ask your health care provider, or the department that is doing the procedure, when your results will be ready. Summary  A CT angiogram is a procedure to look at the blood vessels in various areas of the body.  You will need to stay very still during the exam.  You may be asked to drink water or other fluids to wash (flush) the contrast material out of your body after your scan. This information is not intended to replace advice given to you by your health care provider. Make sure you discuss any questions you have with your health care provider. Document Released: 06/16/2016 Document Revised: 06/16/2016 Document Reviewed: 06/16/2016 Elsevier Interactive Patient Education  [employer].

## 2018-12-25 DIAGNOSIS — D649 Anemia, unspecified: Secondary | ICD-10-CM | POA: Diagnosis not present

## 2018-12-25 DIAGNOSIS — N183 Chronic kidney disease, stage 3 (moderate): Secondary | ICD-10-CM | POA: Diagnosis not present

## 2018-12-25 DIAGNOSIS — I129 Hypertensive chronic kidney disease with stage 1 through stage 4 chronic kidney disease, or unspecified chronic kidney disease: Secondary | ICD-10-CM | POA: Diagnosis not present

## 2018-12-31 ENCOUNTER — Ambulatory Visit (INDEPENDENT_AMBULATORY_CARE_PROVIDER_SITE_OTHER): Payer: Medicare PPO

## 2018-12-31 DIAGNOSIS — I10 Essential (primary) hypertension: Secondary | ICD-10-CM | POA: Diagnosis not present

## 2018-12-31 NOTE — Progress Notes (Signed)
Complete echocardiogram has been performed.  Jimmy Estil Vallee RDCS, RVT

## 2019-02-22 DIAGNOSIS — E1159 Type 2 diabetes mellitus with other circulatory complications: Secondary | ICD-10-CM | POA: Diagnosis not present

## 2019-02-22 DIAGNOSIS — Z6831 Body mass index (BMI) 31.0-31.9, adult: Secondary | ICD-10-CM | POA: Diagnosis not present

## 2019-02-22 DIAGNOSIS — G4700 Insomnia, unspecified: Secondary | ICD-10-CM | POA: Diagnosis not present

## 2019-02-22 DIAGNOSIS — I10 Essential (primary) hypertension: Secondary | ICD-10-CM | POA: Diagnosis not present

## 2019-02-22 DIAGNOSIS — E1143 Type 2 diabetes mellitus with diabetic autonomic (poly)neuropathy: Secondary | ICD-10-CM | POA: Diagnosis not present

## 2019-02-22 DIAGNOSIS — E7800 Pure hypercholesterolemia, unspecified: Secondary | ICD-10-CM | POA: Diagnosis not present

## 2019-03-04 ENCOUNTER — Other Ambulatory Visit: Payer: Self-pay | Admitting: Cardiology

## 2019-03-04 DIAGNOSIS — I2510 Atherosclerotic heart disease of native coronary artery without angina pectoris: Secondary | ICD-10-CM

## 2019-03-04 DIAGNOSIS — I209 Angina pectoris, unspecified: Secondary | ICD-10-CM

## 2019-03-18 DIAGNOSIS — I7789 Other specified disorders of arteries and arterioles: Secondary | ICD-10-CM | POA: Diagnosis not present

## 2019-03-18 DIAGNOSIS — I10 Essential (primary) hypertension: Secondary | ICD-10-CM | POA: Diagnosis not present

## 2019-03-18 LAB — BASIC METABOLIC PANEL
BUN/Creatinine Ratio: 13 (ref 10–24)
BUN: 18 mg/dL (ref 8–27)
CO2: 24 mmol/L (ref 20–29)
Calcium: 9.5 mg/dL (ref 8.6–10.2)
Chloride: 100 mmol/L (ref 96–106)
Creatinine, Ser: 1.41 mg/dL — ABNORMAL HIGH (ref 0.76–1.27)
GFR calc Af Amer: 58 mL/min/{1.73_m2} — ABNORMAL LOW (ref >59)
GFR calc non Af Amer: 50 mL/min/{1.73_m2} — ABNORMAL LOW (ref >59)
GLUCOSE: 135 mg/dL — AB (ref 65–99)
POTASSIUM: 4.4 mmol/L (ref 3.5–5.2)
Sodium: 137 mmol/L (ref 134–144)

## 2019-03-20 ENCOUNTER — Other Ambulatory Visit: Payer: Self-pay

## 2019-03-20 ENCOUNTER — Ambulatory Visit (HOSPITAL_BASED_OUTPATIENT_CLINIC_OR_DEPARTMENT_OTHER)
Admission: RE | Admit: 2019-03-20 | Discharge: 2019-03-20 | Disposition: A | Discharge status: Home / Self Care | Payer: Medicare PPO | Source: Ambulatory Visit | Attending: Cardiology | Admitting: Cardiology

## 2019-03-20 ENCOUNTER — Encounter (HOSPITAL_BASED_OUTPATIENT_CLINIC_OR_DEPARTMENT_OTHER): Payer: Self-pay

## 2019-03-20 DIAGNOSIS — I7789 Other specified disorders of arteries and arterioles: Secondary | ICD-10-CM | POA: Insufficient documentation

## 2019-03-20 DIAGNOSIS — I2510 Atherosclerotic heart disease of native coronary artery without angina pectoris: Secondary | ICD-10-CM | POA: Diagnosis not present

## 2019-03-20 DIAGNOSIS — I700 Atherosclerosis of aorta: Secondary | ICD-10-CM | POA: Diagnosis not present

## 2019-03-20 DIAGNOSIS — I10 Essential (primary) hypertension: Secondary | ICD-10-CM | POA: Diagnosis not present

## 2019-03-20 MED ORDER — IOHEXOL 350 MG/ML SOLN
100.0000 mL | Freq: Once | INTRAVENOUS | Status: AC | PRN
  Administered 2019-03-20: 09:00:00 100 mL via INTRAVENOUS

## 2019-03-23 ENCOUNTER — Other Ambulatory Visit: Payer: Self-pay | Admitting: Cardiology

## 2019-04-12 DIAGNOSIS — Z03818 Encounter for observation for suspected exposure to other biological agents ruled out: Secondary | ICD-10-CM | POA: Diagnosis not present

## 2019-04-15 ENCOUNTER — Other Ambulatory Visit: Payer: Self-pay | Admitting: Cardiology

## 2019-06-03 DIAGNOSIS — Z0000 Encounter for general adult medical examination without abnormal findings: Secondary | ICD-10-CM | POA: Diagnosis not present

## 2019-06-03 DIAGNOSIS — E785 Hyperlipidemia, unspecified: Secondary | ICD-10-CM | POA: Diagnosis not present

## 2019-06-03 DIAGNOSIS — E1143 Type 2 diabetes mellitus with diabetic autonomic (poly)neuropathy: Secondary | ICD-10-CM | POA: Diagnosis not present

## 2019-06-03 DIAGNOSIS — Z9181 History of falling: Secondary | ICD-10-CM | POA: Diagnosis not present

## 2019-06-03 DIAGNOSIS — Z6830 Body mass index (BMI) 30.0-30.9, adult: Secondary | ICD-10-CM | POA: Diagnosis not present

## 2019-06-03 DIAGNOSIS — Z79899 Other long term (current) drug therapy: Secondary | ICD-10-CM | POA: Diagnosis not present

## 2019-07-03 DIAGNOSIS — Z800 Family history of malignant neoplasm of digestive organs: Secondary | ICD-10-CM | POA: Diagnosis not present

## 2019-07-03 DIAGNOSIS — D124 Benign neoplasm of descending colon: Secondary | ICD-10-CM | POA: Diagnosis not present

## 2019-07-03 DIAGNOSIS — K635 Polyp of colon: Secondary | ICD-10-CM | POA: Diagnosis not present

## 2019-07-03 DIAGNOSIS — Z1211 Encounter for screening for malignant neoplasm of colon: Secondary | ICD-10-CM | POA: Diagnosis not present

## 2019-07-03 DIAGNOSIS — K5730 Diverticulosis of large intestine without perforation or abscess without bleeding: Secondary | ICD-10-CM | POA: Diagnosis not present

## 2019-07-15 ENCOUNTER — Other Ambulatory Visit: Payer: Self-pay | Admitting: Cardiology

## 2019-07-15 DIAGNOSIS — R609 Edema, unspecified: Secondary | ICD-10-CM | POA: Diagnosis not present

## 2019-07-15 DIAGNOSIS — N183 Chronic kidney disease, stage 3 (moderate): Secondary | ICD-10-CM | POA: Diagnosis not present

## 2019-07-15 DIAGNOSIS — D649 Anemia, unspecified: Secondary | ICD-10-CM | POA: Diagnosis not present

## 2019-07-15 DIAGNOSIS — I129 Hypertensive chronic kidney disease with stage 1 through stage 4 chronic kidney disease, or unspecified chronic kidney disease: Secondary | ICD-10-CM | POA: Diagnosis not present

## 2019-07-15 DIAGNOSIS — Z23 Encounter for immunization: Secondary | ICD-10-CM | POA: Diagnosis not present

## 2019-07-23 DIAGNOSIS — N451 Epididymitis: Secondary | ICD-10-CM | POA: Diagnosis not present

## 2019-07-23 DIAGNOSIS — N50811 Right testicular pain: Secondary | ICD-10-CM | POA: Diagnosis not present

## 2019-07-24 DIAGNOSIS — I69354 Hemiplegia and hemiparesis following cerebral infarction affecting left non-dominant side: Secondary | ICD-10-CM | POA: Diagnosis not present

## 2019-07-24 DIAGNOSIS — E669 Obesity, unspecified: Secondary | ICD-10-CM | POA: Diagnosis not present

## 2019-07-24 DIAGNOSIS — I253 Aneurysm of heart: Secondary | ICD-10-CM | POA: Diagnosis not present

## 2019-07-24 DIAGNOSIS — M19042 Primary osteoarthritis, left hand: Secondary | ICD-10-CM | POA: Diagnosis not present

## 2019-07-24 DIAGNOSIS — E785 Hyperlipidemia, unspecified: Secondary | ICD-10-CM | POA: Diagnosis not present

## 2019-07-24 DIAGNOSIS — K219 Gastro-esophageal reflux disease without esophagitis: Secondary | ICD-10-CM | POA: Diagnosis not present

## 2019-07-24 DIAGNOSIS — E1165 Type 2 diabetes mellitus with hyperglycemia: Secondary | ICD-10-CM | POA: Diagnosis not present

## 2019-07-24 DIAGNOSIS — Z7982 Long term (current) use of aspirin: Secondary | ICD-10-CM | POA: Diagnosis not present

## 2019-07-24 DIAGNOSIS — M19041 Primary osteoarthritis, right hand: Secondary | ICD-10-CM | POA: Diagnosis not present

## 2019-07-24 DIAGNOSIS — E559 Vitamin D deficiency, unspecified: Secondary | ICD-10-CM | POA: Diagnosis not present

## 2019-07-24 DIAGNOSIS — F1290 Cannabis use, unspecified, uncomplicated: Secondary | ICD-10-CM | POA: Diagnosis not present

## 2019-07-24 DIAGNOSIS — D631 Anemia in chronic kidney disease: Secondary | ICD-10-CM | POA: Diagnosis not present

## 2019-07-24 DIAGNOSIS — M79605 Pain in left leg: Secondary | ICD-10-CM | POA: Diagnosis not present

## 2019-07-24 DIAGNOSIS — E1142 Type 2 diabetes mellitus with diabetic polyneuropathy: Secondary | ICD-10-CM | POA: Diagnosis not present

## 2019-07-24 DIAGNOSIS — E1122 Type 2 diabetes mellitus with diabetic chronic kidney disease: Secondary | ICD-10-CM | POA: Diagnosis not present

## 2019-07-24 DIAGNOSIS — M545 Low back pain: Secondary | ICD-10-CM | POA: Diagnosis not present

## 2019-07-24 DIAGNOSIS — Z6831 Body mass index (BMI) 31.0-31.9, adult: Secondary | ICD-10-CM | POA: Diagnosis not present

## 2019-07-24 DIAGNOSIS — K5790 Diverticulosis of intestine, part unspecified, without perforation or abscess without bleeding: Secondary | ICD-10-CM | POA: Diagnosis not present

## 2019-07-24 DIAGNOSIS — M79604 Pain in right leg: Secondary | ICD-10-CM | POA: Diagnosis not present

## 2019-07-24 DIAGNOSIS — G4700 Insomnia, unspecified: Secondary | ICD-10-CM | POA: Diagnosis not present

## 2019-07-29 DIAGNOSIS — R609 Edema, unspecified: Secondary | ICD-10-CM | POA: Diagnosis not present

## 2019-08-19 ENCOUNTER — Other Ambulatory Visit: Payer: Self-pay | Admitting: Cardiology

## 2019-08-27 ENCOUNTER — Other Ambulatory Visit: Payer: Self-pay | Admitting: Cardiology

## 2019-09-02 ENCOUNTER — Other Ambulatory Visit: Payer: Self-pay | Admitting: Cardiology

## 2019-09-02 DIAGNOSIS — I209 Angina pectoris, unspecified: Secondary | ICD-10-CM

## 2019-09-02 DIAGNOSIS — I2510 Atherosclerotic heart disease of native coronary artery without angina pectoris: Secondary | ICD-10-CM

## 2019-09-09 DIAGNOSIS — E1165 Type 2 diabetes mellitus with hyperglycemia: Secondary | ICD-10-CM | POA: Diagnosis not present

## 2019-09-09 DIAGNOSIS — Z6829 Body mass index (BMI) 29.0-29.9, adult: Secondary | ICD-10-CM | POA: Diagnosis not present

## 2019-09-09 DIAGNOSIS — N181 Chronic kidney disease, stage 1: Secondary | ICD-10-CM | POA: Diagnosis not present

## 2019-09-18 ENCOUNTER — Other Ambulatory Visit: Payer: Self-pay

## 2019-09-18 ENCOUNTER — Ambulatory Visit (INDEPENDENT_AMBULATORY_CARE_PROVIDER_SITE_OTHER): Payer: Medicare PPO | Admitting: Cardiology

## 2019-09-18 ENCOUNTER — Encounter: Payer: Self-pay | Admitting: Cardiology

## 2019-09-18 VITALS — BP 136/76 | HR 58 | Ht 68.0 in | Wt 199.0 lb

## 2019-09-18 DIAGNOSIS — E785 Hyperlipidemia, unspecified: Secondary | ICD-10-CM | POA: Diagnosis not present

## 2019-09-18 DIAGNOSIS — I10 Essential (primary) hypertension: Secondary | ICD-10-CM | POA: Diagnosis not present

## 2019-09-18 DIAGNOSIS — I209 Angina pectoris, unspecified: Secondary | ICD-10-CM | POA: Diagnosis not present

## 2019-09-18 DIAGNOSIS — E1142 Type 2 diabetes mellitus with diabetic polyneuropathy: Secondary | ICD-10-CM | POA: Diagnosis not present

## 2019-09-18 DIAGNOSIS — I7789 Other specified disorders of arteries and arterioles: Secondary | ICD-10-CM

## 2019-09-18 DIAGNOSIS — I2510 Atherosclerotic heart disease of native coronary artery without angina pectoris: Secondary | ICD-10-CM

## 2019-09-18 MED ORDER — RANOLAZINE ER 500 MG PO TB12: ORAL_TABLET | ORAL | 1 refills | Status: DC

## 2019-09-18 MED ORDER — ISOSORBIDE MONONITRATE ER 30 MG PO TB24: 30.0000 mg | ORAL_TABLET | Freq: Every day | ORAL | 0 refills | Status: DC

## 2019-09-18 NOTE — Addendum Note (Signed)
Addended by: Beckey Rutter on: 09/18/2019 04:09 PM   Modules accepted: Orders

## 2019-09-18 NOTE — Progress Notes (Signed)
Cardiology Office Note:    Date:  09/18/2019   ID:  Nathan Sloan, DOB September 15, 1951, MRN [id number]  PCP:  Angelina Sheriff, MD  Cardiologist:  Jenean Lindau, MD   Referring MD: Angelina Sheriff, MD    ASSESSMENT:    1. Angina pectoris ([place])   2. Coronary artery disease involving native coronary artery of native heart without angina pectoris   3. Essential hypertension   4. Diabetic peripheral neuropathy ([place])   5. Dyslipidemia   6. Aortic root enlargement ([facility])    PLAN:    In order of problems listed above:  1. Coronary artery disease: Secondary prevention stressed to the patient.  Importance of compliance with diet and medication stressed and he vocalized understanding.  His vital signs are stable and his walking on a regular basis.  His angina is stable. 2. Essential hypertension: Blood pressure stable and salt intake issues were discussed 3. Mixed dyslipidemia and diabetes mellitus: Lipids followed by primary care physician and he tells me that recent blood work was fine and I will request a copy of those records 4. Aortic root enlargement: This will need to be monitored and my partner sees him on a regular basis and patient will be monitored according to the guidelines. 5. Patient will be seen in follow-up appointment in 6 months or earlier if the patient has any concerns    Medication Adjustments/Labs and Tests Ordered: Current medicines are reviewed at length with the patient today.  Concerns regarding medicines are outlined above.  No orders of the defined types were placed in this encounter.  Meds ordered this encounter  Medications  . isosorbide mononitrate (IMDUR) 30 MG 24 hr tablet    Sig: Take 1 tablet (30 mg total) by mouth daily. PLEASE SCHEDULE APPOINTMENT FOR REFILLS    Dispense:  90 tablet    Refill:  0  . ranolazine (RANEXA) 500 MG 12 hr tablet    Sig: TAKE 1 TABLET BY MOUTH TWICE DAILY,    Dispense:  180 tablet    Refill:  1     No chief  complaint on file.    History of Present Illness:    Nathan Sloan is a 68 y.o. male.  Patient has past medical history of coronary artery disease, essential hypertension dyslipidemia diabetes mellitus and significant renal insufficiency.  He denies any problems at this time and takes care of activities of daily living.  No chest pain orthopnea or PND.  He is tolerating Ranexa well.  He sees my partner on a regular basis.  Past Medical History:  Diagnosis Date  . Chronic kidney disease   . Coronary atherosclerosis of native coronary artery   . Diabetes mellitus without complication ([place])   . GERD (gastroesophageal reflux disease)   . Hyperlipidemia   . Hypertension     Past Surgical History:  Procedure Laterality Date  . CYSTECTOMY    . HERNIA REPAIR    . TONSILLECTOMY      Current Medications: Current Meds  Medication Sig  . aspirin (GOODSENSE ASPIRIN) 325 MG tablet Take 165 mg by mouth daily.  Marland Kitchen atorvastatin (LIPITOR) 20 MG tablet Take 20 mg by mouth daily.  . cholecalciferol (VITAMIN D3) 25 MCG (1000 UT) tablet Take 1,000 Units by mouth daily.  . ciprofloxacin (CIPRO) 500 MG tablet   . cloNIDine (CATAPRES) 0.1 MG tablet Take 0.1 mg by mouth 2 (two) times daily.  . Ferrous Sulfate (IRON) 28 MG TABS Take 1 tablet by  mouth daily.  . furosemide (LASIX) 20 MG tablet   . gabapentin (NEURONTIN) 300 MG capsule Take 300 mg by mouth daily.   Marland Kitchen glimepiride (AMARYL) 4 MG tablet Take 2 mg by mouth 2 (two) times daily.  . insulin NPH-regular Human (NOVOLIN 70/30) (70-30) 100 UNIT/ML injection   . isosorbide mononitrate (IMDUR) 30 MG 24 hr tablet Take 1 tablet (30 mg total) by mouth daily. PLEASE SCHEDULE APPOINTMENT FOR REFILLS  . Multiple Vitamin (MULTIVITAMIN) capsule Take 1 capsule by mouth daily.  . nitroGLYCERIN (NITROSTAT) 0.4 MG SL tablet DISSOLVE ONE TABLET UNDER THE TONGUE EVERY 5 MINUTES AS NEEDED FOR CHEST PAIN  . Omega-3 Fatty Acids (FISH OIL) 1000 MG CAPS Take 1,000 mg by  mouth 2 (two) times daily.  . pantoprazole (PROTONIX) 40 MG tablet Take 40 mg by mouth 2 (two) times daily.  . ranolazine (RANEXA) 500 MG 12 hr tablet TAKE 1 TABLET BY MOUTH TWICE DAILY,  . traZODone (DESYREL) 50 MG tablet Take 50 mg by mouth daily.  . vitamin B-12 (CYANOCOBALAMIN) 500 MCG tablet Take 1 tablet by mouth daily.  . vitamin C (ASCORBIC ACID) 500 MG tablet Take 500 mg by mouth daily.  . [DISCONTINUED] isosorbide mononitrate (IMDUR) 30 MG 24 hr tablet Take 1 tablet (30 mg total) by mouth daily. PLEASE SCHEDULE APPOINTMENT FOR REFILLS  . [DISCONTINUED] ranolazine (RANEXA) 500 MG 12 hr tablet TAKE 1 TABLET BY MOUTH TWICE DAILY, PLEASE CALL OFFICE TO SCHEDULE FOLLOW UP APPOINTMENT FOR FUTURE REFILLS.     Allergies:   Sulfa antibiotics, Escitalopram oxalate, Hydrochlorothiazide, Ibuprofen, and Lisinopril   Social History   Socioeconomic History  . Marital status: Single    Spouse name: Not on file  . Number of children: Not on file  . Years of education: Not on file  . Highest education level: Not on file  Occupational History  . Not on file  Social Needs  . Financial resource strain: Not on file  . Food insecurity    Worry: Not on file    Inability: Not on file  . Transportation needs    Medical: Not on file    Non-medical: Not on file  Tobacco Use  . Smoking status: Never Smoker  . Smokeless tobacco: Never Used  Substance and Sexual Activity  . Alcohol use: No  . Drug use: No  . Sexual activity: Not on file  Lifestyle  . Physical activity    Days per week: Not on file    Minutes per session: Not on file  . Stress: Not on file  Relationships  . Social Herbalist on phone: Not on file    Gets together: Not on file    Attends religious service: Not on file    Active member of club or organization: Not on file    Attends meetings of clubs or organizations: Not on file    Relationship status: Not on file  Other Topics Concern  . Not on file  Social  History Narrative  . Not on file     Family History: The patient's family history includes Breast cancer in his mother and sister; Colon cancer in his sister.  ROS:   Please see the history of present illness.    All other systems reviewed and are negative.  EKGs/Labs/Other Studies Reviewed:    The following studies were reviewed today: EKG reveals sinus rhythm and nonspecific ST-T changes   Recent Labs: 03/18/2019: BUN 18; Creatinine, Ser 1.41; Potassium 4.4; Sodium  137  Recent Lipid Panel No results found for: CHOL, TRIG, HDL, CHOLHDL, VLDL, LDLCALC, LDLDIRECT  Physical Exam:    VS:  BP 136/76 (BP Location: Left Arm, Patient Position: Sitting, Cuff Size: Normal)   Pulse (!) 58   Ht 5\' 8"  (1.727 m)   Wt 199 lb 0.6 oz (90.3 kg)   SpO2 96%   BMI 30.26 kg/m     Wt Readings from Last 3 Encounters:  09/18/19 199 lb 0.6 oz (90.3 kg)  12/03/18 206 lb 12.8 oz (93.8 kg)  08/28/18 199 lb 9.6 oz (90.5 kg)     GEN: Patient is in no acute distress HEENT: Normal NECK: No JVD; No carotid bruits LYMPHATICS: No lymphadenopathy CARDIAC: Hear sounds regular, 2/6 systolic murmur at the apex. RESPIRATORY:  Clear to auscultation without rales, wheezing or rhonchi  ABDOMEN: Soft, non-tender, non-distended MUSCULOSKELETAL:  No edema; No deformity  SKIN: Warm and dry NEUROLOGIC:  Alert and oriented x 3 PSYCHIATRIC:  Normal affect   Signed, Jenean Lindau, MD  09/18/2019 10:49 AM    [place]

## 2019-09-18 NOTE — Patient Instructions (Signed)
Medication Instructions:  Your physician recommends that you continue on your current medications as directed. Please refer to the Current Medication list given to you today.  *If you need a refill on your cardiac medications before your next appointment, please call your pharmacy*  Lab Work: NONE If you have labs (blood work) drawn today and your tests are completely normal, you will receive your results only by: Marland Kitchen MyChart Message (if you have MyChart) OR . A paper copy in the mail If you have any lab test that is abnormal or we need to change your treatment, we will call you to review the results.  Testing/Procedures: You had an EKG performed today  Follow-Up: At [facility], you and your health needs are our priority.  As part of our continuing mission to provide you with exceptional heart care, we have created designated Provider Care Teams.  These Care Teams include your primary Cardiologist (physician) and Advanced Practice Providers (APPs -  Physician Assistants and Nurse Practitioners) who all work together to provide you with the care you need, when you need it.  Your next appointment:   6 month(s)  The format for your next appointment:   In Person  Provider:   Jenne Campus, MD

## 2019-10-03 DIAGNOSIS — N451 Epididymitis: Secondary | ICD-10-CM | POA: Diagnosis not present

## 2019-10-03 DIAGNOSIS — N50811 Right testicular pain: Secondary | ICD-10-CM | POA: Diagnosis not present

## 2019-11-04 DIAGNOSIS — N183 Chronic kidney disease, stage 3 unspecified: Secondary | ICD-10-CM | POA: Diagnosis not present

## 2019-11-14 DIAGNOSIS — R609 Edema, unspecified: Secondary | ICD-10-CM | POA: Diagnosis not present

## 2019-11-14 DIAGNOSIS — N183 Chronic kidney disease, stage 3 unspecified: Secondary | ICD-10-CM | POA: Diagnosis not present

## 2019-11-14 DIAGNOSIS — E559 Vitamin D deficiency, unspecified: Secondary | ICD-10-CM | POA: Diagnosis not present

## 2019-11-14 DIAGNOSIS — D649 Anemia, unspecified: Secondary | ICD-10-CM | POA: Diagnosis not present

## 2019-11-14 DIAGNOSIS — I129 Hypertensive chronic kidney disease with stage 1 through stage 4 chronic kidney disease, or unspecified chronic kidney disease: Secondary | ICD-10-CM | POA: Diagnosis not present

## 2019-12-03 DIAGNOSIS — E1165 Type 2 diabetes mellitus with hyperglycemia: Secondary | ICD-10-CM | POA: Diagnosis not present

## 2019-12-10 DIAGNOSIS — I10 Essential (primary) hypertension: Secondary | ICD-10-CM | POA: Diagnosis not present

## 2019-12-10 DIAGNOSIS — G4700 Insomnia, unspecified: Secondary | ICD-10-CM | POA: Diagnosis not present

## 2019-12-10 DIAGNOSIS — K219 Gastro-esophageal reflux disease without esophagitis: Secondary | ICD-10-CM | POA: Diagnosis not present

## 2019-12-10 DIAGNOSIS — E7800 Pure hypercholesterolemia, unspecified: Secondary | ICD-10-CM | POA: Diagnosis not present

## 2019-12-10 DIAGNOSIS — N189 Chronic kidney disease, unspecified: Secondary | ICD-10-CM | POA: Diagnosis not present

## 2019-12-10 DIAGNOSIS — D638 Anemia in other chronic diseases classified elsewhere: Secondary | ICD-10-CM | POA: Diagnosis not present

## 2019-12-10 DIAGNOSIS — Z6830 Body mass index (BMI) 30.0-30.9, adult: Secondary | ICD-10-CM | POA: Diagnosis not present

## 2019-12-10 DIAGNOSIS — E1143 Type 2 diabetes mellitus with diabetic autonomic (poly)neuropathy: Secondary | ICD-10-CM | POA: Diagnosis not present

## 2020-01-09 DIAGNOSIS — Z8673 Personal history of transient ischemic attack (TIA), and cerebral infarction without residual deficits: Secondary | ICD-10-CM | POA: Diagnosis not present

## 2020-01-09 DIAGNOSIS — N183 Chronic kidney disease, stage 3 unspecified: Secondary | ICD-10-CM | POA: Diagnosis not present

## 2020-01-09 DIAGNOSIS — H9190 Unspecified hearing loss, unspecified ear: Secondary | ICD-10-CM | POA: Diagnosis not present

## 2020-01-09 DIAGNOSIS — E1122 Type 2 diabetes mellitus with diabetic chronic kidney disease: Secondary | ICD-10-CM | POA: Diagnosis not present

## 2020-01-09 DIAGNOSIS — G4700 Insomnia, unspecified: Secondary | ICD-10-CM | POA: Diagnosis not present

## 2020-01-09 DIAGNOSIS — M545 Low back pain: Secondary | ICD-10-CM | POA: Diagnosis not present

## 2020-01-09 DIAGNOSIS — H269 Unspecified cataract: Secondary | ICD-10-CM | POA: Diagnosis not present

## 2020-01-09 DIAGNOSIS — D631 Anemia in chronic kidney disease: Secondary | ICD-10-CM | POA: Diagnosis not present

## 2020-01-09 DIAGNOSIS — E559 Vitamin D deficiency, unspecified: Secondary | ICD-10-CM | POA: Diagnosis not present

## 2020-01-09 DIAGNOSIS — E785 Hyperlipidemia, unspecified: Secondary | ICD-10-CM | POA: Diagnosis not present

## 2020-01-09 DIAGNOSIS — Z6830 Body mass index (BMI) 30.0-30.9, adult: Secondary | ICD-10-CM | POA: Diagnosis not present

## 2020-01-09 DIAGNOSIS — Z794 Long term (current) use of insulin: Secondary | ICD-10-CM | POA: Diagnosis not present

## 2020-01-09 DIAGNOSIS — E669 Obesity, unspecified: Secondary | ICD-10-CM | POA: Diagnosis not present

## 2020-01-09 DIAGNOSIS — E1142 Type 2 diabetes mellitus with diabetic polyneuropathy: Secondary | ICD-10-CM | POA: Diagnosis not present

## 2020-03-02 ENCOUNTER — Other Ambulatory Visit: Payer: Self-pay | Admitting: Cardiology

## 2020-03-02 DIAGNOSIS — I209 Angina pectoris, unspecified: Secondary | ICD-10-CM

## 2020-03-02 DIAGNOSIS — I2510 Atherosclerotic heart disease of native coronary artery without angina pectoris: Secondary | ICD-10-CM

## 2020-03-04 ENCOUNTER — Encounter: Payer: Self-pay | Admitting: Cardiology

## 2020-03-04 ENCOUNTER — Ambulatory Visit: Payer: Medicare PPO | Admitting: Cardiology

## 2020-03-04 ENCOUNTER — Other Ambulatory Visit: Payer: Self-pay

## 2020-03-04 VITALS — BP 120/68 | HR 62 | Ht 68.0 in | Wt 203.0 lb

## 2020-03-04 DIAGNOSIS — E119 Type 2 diabetes mellitus without complications: Secondary | ICD-10-CM | POA: Diagnosis not present

## 2020-03-04 DIAGNOSIS — I10 Essential (primary) hypertension: Secondary | ICD-10-CM | POA: Diagnosis not present

## 2020-03-04 DIAGNOSIS — I7789 Other specified disorders of arteries and arterioles: Secondary | ICD-10-CM | POA: Diagnosis not present

## 2020-03-04 DIAGNOSIS — I2510 Atherosclerotic heart disease of native coronary artery without angina pectoris: Secondary | ICD-10-CM

## 2020-03-04 DIAGNOSIS — E785 Hyperlipidemia, unspecified: Secondary | ICD-10-CM | POA: Diagnosis not present

## 2020-03-04 DIAGNOSIS — I209 Angina pectoris, unspecified: Secondary | ICD-10-CM | POA: Diagnosis not present

## 2020-03-04 NOTE — Progress Notes (Signed)
Cardiology Office Note:    Date:  03/04/2020   ID:  Nathan Sloan, DOB January 13, 1951, MRN [id number]  PCP:  Angelina Sheriff, MD  Cardiologist:  Jenne Campus, MD    Referring MD: Angelina Sheriff, MD   No chief complaint on file. I am short of breath  History of Present Illness:    Nathan Sloan is a 69 y.o. male with past medical history significant for remote coronary artery disease, essential hypertension, ascending aortic aneurysm, hyperlipidemia, hypertension.  Comes today to my office for follow-up.  Recently he started experiencing some chest pain pain is atypical and is happening when twisting his body but also get more shortness of breath with exertion.  There is no swelling of lower extremities there is no palpitations there is no dizziness no passing out.  Past Medical History:  Diagnosis Date  . Chronic kidney disease   . Coronary atherosclerosis of native coronary artery   . Diabetes mellitus without complication ([place])   . GERD (gastroesophageal reflux disease)   . Hyperlipidemia   . Hypertension     Past Surgical History:  Procedure Laterality Date  . CYSTECTOMY    . HERNIA REPAIR    . TONSILLECTOMY      Current Medications: Current Meds  Medication Sig  . aspirin (GOODSENSE ASPIRIN) 325 MG tablet Take 165 mg by mouth daily.  Marland Kitchen atorvastatin (LIPITOR) 20 MG tablet Take 20 mg by mouth daily.  . cholecalciferol (VITAMIN D3) 25 MCG (1000 UT) tablet Take 1,000 Units by mouth daily.  . cloNIDine (CATAPRES) 0.1 MG tablet Take 0.1 mg by mouth 2 (two) times daily.  . Ferrous Sulfate (IRON) 28 MG TABS Take 1 tablet by mouth daily.  . furosemide (LASIX) 20 MG tablet Take 20 mg by mouth daily.   Marland Kitchen gabapentin (NEURONTIN) 300 MG capsule Take 300 mg by mouth daily.   Marland Kitchen glimepiride (AMARYL) 4 MG tablet Take 2 mg by mouth 2 (two) times daily.  . insulin NPH-regular Human (NOVOLIN 70/30) (70-30) 100 UNIT/ML injection   . isosorbide mononitrate (IMDUR) 30 MG 24 hr tablet  Take 1 tablet by mouth once daily  . Multiple Vitamin (MULTIVITAMIN) capsule Take 1 capsule by mouth daily.  . nitroGLYCERIN (NITROSTAT) 0.4 MG SL tablet DISSOLVE ONE TABLET UNDER THE TONGUE EVERY 5 MINUTES AS NEEDED FOR CHEST PAIN  . Omega-3 Fatty Acids (FISH OIL) 1000 MG CAPS Take 1,000 mg by mouth 2 (two) times daily.  . pantoprazole (PROTONIX) 40 MG tablet Take 40 mg by mouth daily.   . ranolazine (RANEXA) 500 MG 12 hr tablet TAKE 1 TABLET BY MOUTH TWICE DAILY,  . traZODone (DESYREL) 50 MG tablet Take 50 mg by mouth daily.  . vitamin B-12 (CYANOCOBALAMIN) 500 MCG tablet Take 1 tablet by mouth daily.  . vitamin C (ASCORBIC ACID) 500 MG tablet Take 500 mg by mouth daily.     Allergies:   Sulfa antibiotics, Escitalopram oxalate, Hydrochlorothiazide, Ibuprofen, and Lisinopril   Social History   Socioeconomic History  . Marital status: Single    Spouse name: Not on file  . Number of children: Not on file  . Years of education: Not on file  . Highest education level: Not on file  Occupational History  . Not on file  Tobacco Use  . Smoking status: Never Smoker  . Smokeless tobacco: Never Used  Substance and Sexual Activity  . Alcohol use: No  . Drug use: No  . Sexual activity: Not on file  Other Topics Concern  . Not on file  Social History Narrative  . Not on file   Social Determinants of Health   Financial Resource Strain:   . Difficulty of Paying Living Expenses:   Food Insecurity:   . Worried About Charity fundraiser in the Last Year:   . Arboriculturist in the Last Year:   Transportation Needs:   . Film/video editor (Medical):   Marland Kitchen Lack of Transportation (Non-Medical):   Physical Activity:   . Days of Exercise per Week:   . Minutes of Exercise per Session:   Stress:   . Feeling of Stress :   Social Connections:   . Frequency of Communication with Friends and Family:   . Frequency of Social Gatherings with Friends and Family:   . Attends Religious Services:    . Active Member of Clubs or Organizations:   . Attends Archivist Meetings:   Marland Kitchen Marital Status:      Family History: The patient's family history includes Breast cancer in his mother and sister; Colon cancer in his sister. ROS:   Please see the history of present illness.    All 14 point review of systems negative except as described per history of present illness  EKGs/Labs/Other Studies Reviewed:      Recent Labs: 03/18/2019: BUN 18; Creatinine, Ser 1.41; Potassium 4.4; Sodium 137  Recent Lipid Panel No results found for: CHOL, TRIG, HDL, CHOLHDL, VLDL, LDLCALC, LDLDIRECT  Physical Exam:    VS:  BP 120/68   Pulse 62   Ht 5\' 8"  (1.727 m)   Wt 203 lb (92.1 kg)   SpO2 96%   BMI 30.87 kg/m     Wt Readings from Last 3 Encounters:  03/04/20 203 lb (92.1 kg)  09/18/19 199 lb 0.6 oz (90.3 kg)  12/03/18 206 lb 12.8 oz (93.8 kg)     GEN:  Well nourished, well developed in no acute distress HEENT: Normal NECK: No JVD; No carotid bruits LYMPHATICS: No lymphadenopathy CARDIAC: RRR, no murmurs, no rubs, no gallops RESPIRATORY:  Clear to auscultation without rales, wheezing or rhonchi  ABDOMEN: Soft, non-tender, non-distended MUSCULOSKELETAL:  No edema; No deformity  SKIN: Warm and dry LOWER EXTREMITIES: no swelling NEUROLOGIC:  Alert and oriented x 3 PSYCHIATRIC:  Normal affect   ASSESSMENT:    1. Angina pectoris ([place])   2. Coronary artery disease involving native coronary artery of native heart without angina pectoris   3. Essential hypertension   4. Aortic root enlargement ([facility])   5. Dyslipidemia    PLAN:    In order of problems listed above:  1. Angina pectoris.  I will ask him to have a stress test to determine the need localization of the problem.  Because he had very rare episodes we will use only as needed basis.  Will continue with Imdur. 2. Coronary disease plan as outlined above.  We will continue with aspirin. 3. Ascending aortic aneurysm.  We  will schedule him to have echocardiogram to look at the aneurysm. 4. Dyslipidemia: He is on Lipitor 20 which I will continue.  I did review K PN which showed me his LDL of 90 and HDL of 42.  We will continue present medication which I will augment his therapy.   Medication Adjustments/Labs and Tests Ordered: Current medicines are reviewed at length with the patient today.  Concerns regarding medicines are outlined above.  No orders of the defined types were placed in this encounter.  Medication changes: No orders of the defined types were placed in this encounter.   Signed, Park Liter, MD, [facility] 03/04/2020 2:22 PM    [facility]

## 2020-03-04 NOTE — Patient Instructions (Signed)
Medication Instructions:  Your physician recommends that you continue on your current medications as directed. Please refer to the Current Medication list given to you today.  *If you need a refill on your cardiac medications before your next appointment, please call your pharmacy*   Lab Work: None.  If you have labs (blood work) drawn today and your tests are completely normal, you will receive your results only by: Marland Kitchen MyChart Message (if you have MyChart) OR . A paper copy in the mail If you have any lab test that is abnormal or we need to change your treatment, we will call you to review the results.   Testing/Procedures: Your physician has requested that you have an echocardiogram. Echocardiography is a painless test that uses sound waves to create images of your heart. It provides your doctor with information about the size and shape of your heart and how well your heart's chambers and valves are working. This procedure takes approximately one hour. There are no restrictions for this procedure.    [facility] [address] [place], [place] [address] Phone:  [phone]    Please arrive 15 minutes prior to your appointment time for registration and insurance purposes.  The test will take approximately 3 to 4 hours to complete; you may bring reading material.  If someone comes with you to your appointment, they will need to remain in the main lobby due to limited space in the testing area. **If you are pregnant or breastfeeding, please notify the nuclear lab prior to your appointment**  How to prepare for your Myocardial Perfusion Test: . Do not eat or drink 3 hours prior to your test, except you may have water. . Do not consume products containing caffeine (regular or decaffeinated) 12 hours prior to your test. (ex: coffee, chocolate, sodas, tea). . Do bring a list of your current medications with you.  If not listed below, you may take your  medications as normal. . HOLD diabetic medication/insulin the morning of the test: glimepiride  . Do wear comfortable clothes (no dresses or overalls) and walking shoes, tennis shoes preferred (No heels or open toe shoes are allowed). . Do NOT wear cologne, perfume, aftershave, or lotions (deodorant is allowed). . If these instructions are not followed, your test will have to be rescheduled.  Please report to [address] for your test.  If you have questions or concerns about your appointment, you can call the [facility] at [phone].  If you cannot keep your appointment, please provide 24 hours notification to the Nuclear Lab, to avoid a possible $50 charge to your account.      Follow-Up: At [facility], you and your health needs are our priority.  As part of our continuing mission to provide you with exceptional heart care, we have created designated Provider Care Teams.  These Care Teams include your primary Cardiologist (physician) and Advanced Practice Providers (APPs -  Physician Assistants and Nurse Practitioners) who all work together to provide you with the care you need, when you need it.  We recommend signing up for the patient portal called "MyChart".  Sign up information is provided on this After Visit Summary.  MyChart is used to connect with patients for Virtual Visits (Telemedicine).  Patients are able to view lab/test results, encounter notes, upcoming appointments, etc.  Non-urgent messages can be sent to your provider as well.   To learn more about what you can do with  MyChart, go to [email or web address].    Your next appointment:   5 month(s)  The format for your next appointment:   In Person  Provider:   Jenne Campus, MD   Other Instructions   Echocardiogram An echocardiogram is a procedure that uses painless sound waves (ultrasound) to produce an image of the heart. Images from an echocardiogram can  provide important information about:  Signs of coronary artery disease (CAD).  Aneurysm detection. An aneurysm is a weak or damaged part of an artery wall that bulges out from the normal force of blood pumping through the body.  Heart size and shape. Changes in the size or shape of the heart can be associated with certain conditions, including heart failure, aneurysm, and CAD.  Heart muscle function.  Heart valve function.  Signs of a past heart attack.  Fluid buildup around the heart.  Thickening of the heart muscle.  A tumor or infectious growth around the heart valves. Tell a health care provider about:  Any allergies you have.  All medicines you are taking, including vitamins, herbs, eye drops, creams, and over-the-counter medicines.  Any blood disorders you have.  Any surgeries you have had.  Any medical conditions you have.  Whether you are pregnant or may be pregnant. What are the risks? Generally, this is a safe procedure. However, problems may occur, including:  Allergic reaction to dye (contrast) that may be used during the procedure. What happens before the procedure? No specific preparation is needed. You may eat and drink normally. What happens during the procedure?   An IV tube may be inserted into one of your veins.  You may receive contrast through this tube. A contrast is an injection that improves the quality of the pictures from your heart.  A gel will be applied to your chest.  A wand-like tool (transducer) will be moved over your chest. The gel will help to transmit the sound waves from the transducer.  The sound waves will harmlessly bounce off of your heart to allow the heart images to be captured in real-time motion. The images will be recorded on a computer. The procedure may vary among health care providers and hospitals. What happens after the procedure?  You may return to your normal, everyday life, including diet, activities, and  medicines, unless your health care provider tells you not to do that. Summary  An echocardiogram is a procedure that uses painless sound waves (ultrasound) to produce an image of the heart.  Images from an echocardiogram can provide important information about the size and shape of your heart, heart muscle function, heart valve function, and fluid buildup around your heart.  You do not need to do anything to prepare before this procedure. You may eat and drink normally.  After the echocardiogram is completed, you may return to your normal, everyday life, unless your health care provider tells you not to do that. This information is not intended to replace advice given to you by your health care provider. Make sure you discuss any questions you have with your health care provider. Document Revised: 02/07/2019 Document Reviewed: 11/19/2016 Elsevier Patient Education  2020 [place].   Cardiac Nuclear Scan A cardiac nuclear scan is a test that measures blood flow to the heart when a person is resting and when he or she is exercising. The test looks for problems such as:  Not enough blood reaching a portion of the heart.  The heart muscle not working normally. You may need this  test if:  You have heart disease.  You have had abnormal lab results.  You have had heart surgery or a balloon procedure to open up blocked arteries (angioplasty).  You have chest pain.  You have shortness of breath. In this test, a radioactive dye (tracer) is injected into your bloodstream. After the tracer has traveled to your heart, an imaging device is used to measure how much of the tracer is absorbed by or distributed to various areas of your heart. This procedure is usually done at a hospital and takes 2-4 hours. Tell a health care provider about:  Any allergies you have.  All medicines you are taking, including vitamins, herbs, eye drops, creams, and over-the-counter medicines.  Any problems you  or family members have had with anesthetic medicines.  Any blood disorders you have.  Any surgeries you have had.  Any medical conditions you have.  Whether you are pregnant or may be pregnant. What are the risks? Generally, this is a safe procedure. However, problems may occur, including:  Serious chest pain and heart attack. This is only a risk if the stress portion of the test is done.  Rapid heartbeat.  Sensation of warmth in your chest. This usually passes quickly.  Allergic reaction to the tracer. What happens before the procedure?  Ask your health care provider about changing or stopping your regular medicines. This is especially important if you are taking diabetes medicines or blood thinners.  Follow instructions from your health care provider about eating or drinking restrictions.  Remove your jewelry on the day of the procedure. What happens during the procedure?  An IV will be inserted into one of your veins.  Your health care provider will inject a small amount of radioactive tracer through the IV.  You will wait for 20-40 minutes while the tracer travels through your bloodstream.  Your heart activity will be monitored with an electrocardiogram (ECG).  You will lie down on an exam table.  Images of your heart will be taken for about 15-20 minutes.  You may also have a stress test. For this test, one of the following may be done: ? You will exercise on a treadmill or stationary bike. While you exercise, your heart's activity will be monitored with an ECG, and your blood pressure will be checked. ? You will be given medicines that will increase blood flow to parts of your heart. This is done if you are unable to exercise.  When blood flow to your heart has peaked, a tracer will again be injected through the IV.  After 20-40 minutes, you will get back on the exam table and have more images taken of your heart.  Depending on the type of tracer used, scans may  need to be repeated 3-4 hours later.  Your IV line will be removed when the procedure is over. The procedure may vary among health care providers and hospitals. What happens after the procedure?  Unless your health care provider tells you otherwise, you may return to your normal schedule, including diet, activities, and medicines.  Unless your health care provider tells you otherwise, you may increase your fluid intake. This will help to flush the contrast dye from your body. Drink enough fluid to keep your urine pale yellow.  Ask your health care provider, or the department that is doing the test: ? When will my results be ready? ? How will I get my results? Summary  A cardiac nuclear scan measures the blood flow to  the heart when a person is resting and when he or she is exercising.  Tell your health care provider if you are pregnant.  Before the procedure, ask your health care provider about changing or stopping your regular medicines. This is especially important if you are taking diabetes medicines or blood thinners.  After the procedure, unless your health care provider tells you otherwise, increase your fluid intake. This will help flush the contrast dye from your body.  After the procedure, unless your health care provider tells you otherwise, you may return to your normal schedule, including diet, activities, and medicines. This information is not intended to replace advice given to you by your health care provider. Make sure you discuss any questions you have with your health care provider. Document Revised: 04/02/2018 Document Reviewed: 04/02/2018 Elsevier Patient Education  [place].

## 2020-03-05 ENCOUNTER — Telehealth (HOSPITAL_COMMUNITY): Payer: Self-pay | Admitting: *Deleted

## 2020-03-05 NOTE — Telephone Encounter (Signed)
Left message on voicemail per DPR in reference to upcoming appointment scheduled on 03/12/20 at 8:15 with detailed instructions given per Myocardial Perfusion Study Information Sheet for the test. LM to arrive 15 minutes early, and that it is imperative to arrive on time for appointment to keep from having the test rescheduled. If you need to cancel or reschedule your appointment, please call the office within 24 hours of your appointment. Failure to do so may result in a cancellation of your appointment, and a $50 no show fee. Phone number given for call back for any questions.

## 2020-03-10 ENCOUNTER — Telehealth: Payer: Self-pay | Admitting: Cardiology

## 2020-03-10 DIAGNOSIS — N189 Chronic kidney disease, unspecified: Secondary | ICD-10-CM | POA: Diagnosis not present

## 2020-03-10 DIAGNOSIS — E7800 Pure hypercholesterolemia, unspecified: Secondary | ICD-10-CM | POA: Diagnosis not present

## 2020-03-10 DIAGNOSIS — E1143 Type 2 diabetes mellitus with diabetic autonomic (poly)neuropathy: Secondary | ICD-10-CM | POA: Diagnosis not present

## 2020-03-10 DIAGNOSIS — Z6829 Body mass index (BMI) 29.0-29.9, adult: Secondary | ICD-10-CM | POA: Diagnosis not present

## 2020-03-10 NOTE — Telephone Encounter (Signed)
Patient returning Nathan Sloan call in regards to his stress test on 03/12/20.

## 2020-03-11 ENCOUNTER — Telehealth (HOSPITAL_COMMUNITY): Payer: Self-pay | Admitting: *Deleted

## 2020-03-11 NOTE — Telephone Encounter (Signed)
Spoke with patient to go over instructions for Myoview on 03/12/20.

## 2020-03-12 ENCOUNTER — Other Ambulatory Visit: Payer: Self-pay

## 2020-03-12 ENCOUNTER — Ambulatory Visit (INDEPENDENT_AMBULATORY_CARE_PROVIDER_SITE_OTHER): Payer: Medicare PPO

## 2020-03-12 DIAGNOSIS — I209 Angina pectoris, unspecified: Secondary | ICD-10-CM

## 2020-03-12 LAB — MYOCARDIAL PERFUSION IMAGING
LV dias vol: 94 mL (ref 62–150)
LV sys vol: 39 mL
Peak HR: 74 {beats}/min
Rest HR: 52 {beats}/min
SDS: 3
SRS: 5
SSS: 8
TID: 1.13

## 2020-03-12 MED ORDER — TECHNETIUM TC 99M TETROFOSMIN IV KIT
10.8000 | PACK | Freq: Once | INTRAVENOUS | Status: AC | PRN
  Administered 2020-03-12: 10.8 via INTRAVENOUS

## 2020-03-12 MED ORDER — REGADENOSON 0.4 MG/5ML IV SOLN
0.4000 mg | Freq: Once | INTRAVENOUS | Status: AC
  Administered 2020-03-12: 0.4 mg via INTRAVENOUS

## 2020-03-12 MED ORDER — TECHNETIUM TC 99M TETROFOSMIN IV KIT
32.5000 | PACK | Freq: Once | INTRAVENOUS | Status: AC | PRN
  Administered 2020-03-12: 32.5 via INTRAVENOUS

## 2020-03-16 ENCOUNTER — Other Ambulatory Visit: Payer: Self-pay | Admitting: Cardiology

## 2020-03-23 ENCOUNTER — Ambulatory Visit (INDEPENDENT_AMBULATORY_CARE_PROVIDER_SITE_OTHER): Payer: Medicare PPO

## 2020-03-23 ENCOUNTER — Other Ambulatory Visit: Payer: Self-pay

## 2020-03-23 DIAGNOSIS — I209 Angina pectoris, unspecified: Secondary | ICD-10-CM

## 2020-03-23 NOTE — Progress Notes (Signed)
Complete echocardiogram has been performed.  Jimmy Rel; RDCS, RVT 

## 2020-03-25 ENCOUNTER — Other Ambulatory Visit: Payer: Self-pay | Admitting: Cardiology

## 2020-03-25 DIAGNOSIS — I209 Angina pectoris, unspecified: Secondary | ICD-10-CM

## 2020-03-25 DIAGNOSIS — I2510 Atherosclerotic heart disease of native coronary artery without angina pectoris: Secondary | ICD-10-CM

## 2020-03-31 NOTE — Progress Notes (Signed)
Echocardiogram showed preserved left ventricle ejection fraction, there is dilatation of the ascending aorta measuring 46 mm.  It was 44 mm in 2020, continue conservative approach

## 2020-04-01 ENCOUNTER — Telehealth: Payer: Self-pay | Admitting: Emergency Medicine

## 2020-04-01 NOTE — Telephone Encounter (Signed)
Transferred call to Hayley 

## 2020-04-01 NOTE — Telephone Encounter (Signed)
Left message for patient to return cal regarding test results.

## 2020-06-12 DIAGNOSIS — Z79899 Other long term (current) drug therapy: Secondary | ICD-10-CM | POA: Diagnosis not present

## 2020-06-12 DIAGNOSIS — Z125 Encounter for screening for malignant neoplasm of prostate: Secondary | ICD-10-CM | POA: Diagnosis not present

## 2020-06-12 DIAGNOSIS — E119 Type 2 diabetes mellitus without complications: Secondary | ICD-10-CM | POA: Diagnosis not present

## 2020-06-12 DIAGNOSIS — E7800 Pure hypercholesterolemia, unspecified: Secondary | ICD-10-CM | POA: Diagnosis not present

## 2020-06-16 DIAGNOSIS — Z6826 Body mass index (BMI) 26.0-26.9, adult: Secondary | ICD-10-CM | POA: Diagnosis not present

## 2020-06-16 DIAGNOSIS — Z0000 Encounter for general adult medical examination without abnormal findings: Secondary | ICD-10-CM | POA: Diagnosis not present

## 2020-07-07 DIAGNOSIS — D649 Anemia, unspecified: Secondary | ICD-10-CM | POA: Diagnosis not present

## 2020-07-07 DIAGNOSIS — N183 Chronic kidney disease, stage 3 unspecified: Secondary | ICD-10-CM | POA: Diagnosis not present

## 2020-07-15 DIAGNOSIS — D649 Anemia, unspecified: Secondary | ICD-10-CM | POA: Diagnosis not present

## 2020-07-15 DIAGNOSIS — N1831 Chronic kidney disease, stage 3a: Secondary | ICD-10-CM | POA: Diagnosis not present

## 2020-07-15 DIAGNOSIS — R609 Edema, unspecified: Secondary | ICD-10-CM | POA: Diagnosis not present

## 2020-07-15 DIAGNOSIS — I129 Hypertensive chronic kidney disease with stage 1 through stage 4 chronic kidney disease, or unspecified chronic kidney disease: Secondary | ICD-10-CM | POA: Diagnosis not present

## 2020-07-15 DIAGNOSIS — E559 Vitamin D deficiency, unspecified: Secondary | ICD-10-CM | POA: Diagnosis not present

## 2020-07-29 ENCOUNTER — Other Ambulatory Visit: Payer: Self-pay | Admitting: Cardiology

## 2020-07-29 DIAGNOSIS — I2510 Atherosclerotic heart disease of native coronary artery without angina pectoris: Secondary | ICD-10-CM

## 2020-07-29 DIAGNOSIS — I209 Angina pectoris, unspecified: Secondary | ICD-10-CM

## 2020-08-31 ENCOUNTER — Other Ambulatory Visit: Payer: Self-pay | Admitting: Cardiology

## 2020-08-31 DIAGNOSIS — I209 Angina pectoris, unspecified: Secondary | ICD-10-CM

## 2020-08-31 DIAGNOSIS — I2510 Atherosclerotic heart disease of native coronary artery without angina pectoris: Secondary | ICD-10-CM

## 2020-09-08 ENCOUNTER — Encounter: Payer: Self-pay | Admitting: Cardiology

## 2020-09-08 ENCOUNTER — Other Ambulatory Visit: Payer: Self-pay

## 2020-09-08 ENCOUNTER — Ambulatory Visit: Payer: Medicare PPO | Admitting: Cardiology

## 2020-09-08 VITALS — BP 110/70 | HR 67 | Ht 68.0 in | Wt 166.0 lb

## 2020-09-08 DIAGNOSIS — I7789 Other specified disorders of arteries and arterioles: Secondary | ICD-10-CM | POA: Diagnosis not present

## 2020-09-08 DIAGNOSIS — E785 Hyperlipidemia, unspecified: Secondary | ICD-10-CM | POA: Diagnosis not present

## 2020-09-08 DIAGNOSIS — I10 Essential (primary) hypertension: Secondary | ICD-10-CM | POA: Diagnosis not present

## 2020-09-08 DIAGNOSIS — I2510 Atherosclerotic heart disease of native coronary artery without angina pectoris: Secondary | ICD-10-CM | POA: Diagnosis not present

## 2020-09-08 NOTE — Patient Instructions (Signed)

## 2020-09-08 NOTE — Progress Notes (Signed)
Cardiology Office Note:    Date:  09/08/2020   ID:  Nathan Sloan, DOB [birth year], MRN [id number]  PCP:  Angelina Sheriff, MD  Cardiologist:  Jenne Campus, MD    Referring MD: Angelina Sheriff, MD   No chief complaint on file. Doing very well  History of Present Illness:    Nathan Sloan is a 69 y.o. 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  He comes today to my for follow-up overall doing well he lost significant amount of weight intentionally.  He got scared because of his diabetes change some medication was started watching his eating habits and lost significant amount of weight.  Feeling better.  Also last time when I seen him in May he was complaining of having symptoms of chest pain for which he had to take nitroglycerin.  Stress test was done showed no evidence of ischemia.  He said he have to use nitroglycerin about once a month.  Otherwise seems to be doing well.  He is very energetic he has today he was working the garden have no difficulty doing it.  Past Medical History:  Diagnosis Date  . Chronic kidney disease   . Coronary atherosclerosis of native coronary artery   . Diabetes mellitus without complication ([place])   . GERD (gastroesophageal reflux disease)   . Hyperlipidemia   . Hypertension     Past Surgical History:  Procedure Laterality Date  . CYSTECTOMY    . HERNIA REPAIR    . TONSILLECTOMY      Current Medications: No outpatient medications have been marked as taking for the 09/08/20 encounter (Appointment) with Park Liter, MD.     Allergies:   Sulfa antibiotics, Escitalopram oxalate, Hydrochlorothiazide, Ibuprofen, and Lisinopril   Social History   Socioeconomic History  . Marital status: Single    Spouse name: Not on file  . Number of children: Not on file  . Years of education: Not on file    . Highest education level: Not on file  Occupational History  . Not on file  Tobacco Use  . Smoking status: Never Smoker  . Smokeless tobacco: Never Used  Vaping Use  . Vaping Use: Never used  Substance and Sexual Activity  . Alcohol use: No  . Drug use: No  . Sexual activity: Not on file  Other Topics Concern  . Not on file  Social History Narrative  . Not on file   Social Determinants of Health   Financial Resource Strain:   . Difficulty of Paying Living Expenses: Not on file  Food Insecurity:   . Worried About Charity fundraiser in the Last Year: Not on file  . Ran Out of Food in the Last Year: Not on file  Transportation Needs:   . Lack of Transportation (Medical): Not on file  . Lack of Transportation (Non-Medical): Not on file  Physical Activity:   . Days of Exercise per Week: Not on file  . Minutes of Exercise per Session: Not on file  Stress:   . Feeling of Stress : Not on file  Social Connections:   . Frequency of Communication with Friends and Family: Not on file  . Frequency of Social Gatherings with Friends and Family: Not on file  . Attends Religious Services: Not on file  . Active Member of Clubs or Organizations: Not on file  .  Attends Archivist Meetings: Not on file  . Marital Status: Not on file     Family History: The patient's family history includes Breast cancer in his mother and sister; Colon cancer in his sister. ROS:   Please see the history of present illness.    All 14 point review of systems negative except as described per history of present illness  EKGs/Labs/Other Studies Reviewed:    Stress test on 03/12/2020 showed:  The left ventricular ejection fraction is normal (55-65%).  Nuclear stress EF: 58%.  There was no ST segment deviation noted during stress.  No T wave inversion was noted during stress.  Defect 1: There is a small fixed defect of mild severity present in the apex location with normal wall  motion.  The study is normal, fixed defect as stated above is likely soft tissue attenuation.  This is a low risk study.  Echocardiogram done on 03/23/2020 showed: 1. Left ventricular ejection fraction, by estimation, is 60 to 65%. The  left ventricle has normal function. The left ventricle has no regional  wall motion abnormalities. Left ventricular diastolic parameters are  consistent with Grade I diastolic  dysfunction (impaired relaxation).  2. The aortic valve is normal in structure. Aortic valve regurgitation is  trivial. No aortic stenosis is present.  3. There is moderate dilatation of the ascending aorta measuring 46 mm.    Recent Labs: No results found for requested labs within last 8760 hours.  Recent Lipid Panel No results found for: CHOL, TRIG, HDL, CHOLHDL, VLDL, LDLCALC, LDLDIRECT  Physical Exam:    VS:  There were no vitals taken for this visit.    Wt Readings from Last 3 Encounters:  03/12/20 203 lb (92.1 kg)  03/04/20 203 lb (92.1 kg)  09/18/19 199 lb 0.6 oz (90.3 kg)     GEN:  Well nourished, well developed in no acute distress HEENT: Normal NECK: No JVD; No carotid bruits LYMPHATICS: No lymphadenopathy CARDIAC: RRR, no murmurs, no rubs, no gallops RESPIRATORY:  Clear to auscultation without rales, wheezing or rhonchi  ABDOMEN: Soft, non-tender, non-distended MUSCULOSKELETAL:  No edema; No deformity  SKIN: Warm and dry LOWER EXTREMITIES: no swelling NEUROLOGIC:  Alert and oriented x 3 PSYCHIATRIC:  Normal affect   ASSESSMENT:    1. Coronary artery disease involving native coronary artery of native heart without angina pectoris   2. Aortic root enlargement (HCC)   3. Dyslipidemia   4. Essential hypertension    PLAN:    In order of problems listed above:  1. Coronary disease stable from that point review stress test reviewed negative.  Still required some nitroglycerin on as-needed basis.  Which is about once a month.  I will continue.  I  told him to let me know if frequency of episode of chest pain increase. 2.  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  Will reschedule him to have echocardiogram in summer of next year.  His blood pressure is well controlled.  We will continue present management. Dyslipidemia I did review his K PN which show me his LDL of 76 HDL 38 this is from 06/12/2020.  We will recheck his fasting lipid profile. Essential hypertension his blood pressures well controlled.   Medication Adjustments/Labs and Tests Ordered: Current medicines are reviewed at length with the patient today.  Concerns regarding medicines are outlined above.  No orders of the defined types were placed in this encounter.  Medication changes: No orders of the defined types were placed  in this encounter.   Signed, Park Liter, MD, [facility] 09/08/2020 1:05 PM    [place]

## 2020-09-09 ENCOUNTER — Telehealth: Payer: Self-pay | Admitting: Emergency Medicine

## 2020-09-09 DIAGNOSIS — E785 Hyperlipidemia, unspecified: Secondary | ICD-10-CM

## 2020-09-09 LAB — LIPID PANEL
Chol/HDL Ratio: 3.5 ratio (ref 0.0–5.0)
Cholesterol, Total: 151 mg/dL (ref 100–199)
HDL: 43 mg/dL (ref >39)
LDL Chol Calc (NIH): 83 mg/dL (ref 0–99)
Triglycerides: 143 mg/dL (ref 0–149)
VLDL Cholesterol Cal: 25 mg/dL (ref 5–40)

## 2020-09-09 MED ORDER — ATORVASTATIN CALCIUM 40 MG PO TABS
40.0000 mg | ORAL_TABLET | Freq: Every day | ORAL | 1 refills | Status: DC
Start: 2020-09-09 — End: 2021-02-26

## 2020-09-09 NOTE — Telephone Encounter (Signed)
Called patient. Informed him of results and to increase lipitor to 40 mg and have labs rechecked in 6 weeks he verbally understood.

## 2020-09-09 NOTE — Telephone Encounter (Signed)
-----   Message from Park Liter, MD sent at 09/09/2020 12:00 PM EST ----- Cholesterol still not perfect, please double the dose of Lipitor to 40 mg daily, fasting lipid profile need to be rechecked in 6 weeks including AST ALT

## 2020-10-16 DIAGNOSIS — I10 Essential (primary) hypertension: Secondary | ICD-10-CM | POA: Diagnosis not present

## 2020-10-16 DIAGNOSIS — E785 Hyperlipidemia, unspecified: Secondary | ICD-10-CM | POA: Diagnosis not present

## 2020-10-16 DIAGNOSIS — Z6825 Body mass index (BMI) 25.0-25.9, adult: Secondary | ICD-10-CM | POA: Diagnosis not present

## 2020-10-16 DIAGNOSIS — E7800 Pure hypercholesterolemia, unspecified: Secondary | ICD-10-CM | POA: Diagnosis not present

## 2020-10-16 DIAGNOSIS — E1143 Type 2 diabetes mellitus with diabetic autonomic (poly)neuropathy: Secondary | ICD-10-CM | POA: Diagnosis not present

## 2020-10-16 LAB — LIPID PANEL
Chol/HDL Ratio: 3 ratio (ref 0.0–5.0)
Cholesterol, Total: 151 mg/dL (ref 100–199)
HDL: 51 mg/dL (ref >39)
LDL Chol Calc (NIH): 81 mg/dL (ref 0–99)
Triglycerides: 102 mg/dL (ref 0–149)
VLDL Cholesterol Cal: 19 mg/dL (ref 5–40)

## 2020-10-16 LAB — AST: AST: 18 IU/L (ref 0–40)

## 2020-10-16 LAB — ALT: ALT: 18 IU/L (ref 0–44)

## 2020-11-16 DIAGNOSIS — Z79899 Other long term (current) drug therapy: Secondary | ICD-10-CM | POA: Diagnosis not present

## 2020-11-16 DIAGNOSIS — S0993XA Unspecified injury of face, initial encounter: Secondary | ICD-10-CM | POA: Diagnosis not present

## 2020-11-16 DIAGNOSIS — S01511A Laceration without foreign body of lip, initial encounter: Secondary | ICD-10-CM | POA: Diagnosis not present

## 2020-11-16 DIAGNOSIS — I129 Hypertensive chronic kidney disease with stage 1 through stage 4 chronic kidney disease, or unspecified chronic kidney disease: Secondary | ICD-10-CM | POA: Diagnosis not present

## 2020-11-16 DIAGNOSIS — N189 Chronic kidney disease, unspecified: Secondary | ICD-10-CM | POA: Diagnosis not present

## 2020-11-16 DIAGNOSIS — Z23 Encounter for immunization: Secondary | ICD-10-CM | POA: Diagnosis not present

## 2020-11-16 DIAGNOSIS — E119 Type 2 diabetes mellitus without complications: Secondary | ICD-10-CM | POA: Diagnosis not present

## 2020-11-16 DIAGNOSIS — S01512A Laceration without foreign body of oral cavity, initial encounter: Secondary | ICD-10-CM | POA: Diagnosis not present

## 2020-11-16 DIAGNOSIS — Z7982 Long term (current) use of aspirin: Secondary | ICD-10-CM | POA: Diagnosis not present

## 2020-11-18 DIAGNOSIS — K130 Diseases of lips: Secondary | ICD-10-CM | POA: Diagnosis not present

## 2020-11-18 DIAGNOSIS — Z6825 Body mass index (BMI) 25.0-25.9, adult: Secondary | ICD-10-CM | POA: Diagnosis not present

## 2020-11-18 DIAGNOSIS — D649 Anemia, unspecified: Secondary | ICD-10-CM | POA: Diagnosis not present

## 2020-12-02 DIAGNOSIS — S61551A Open bite of right wrist, initial encounter: Secondary | ICD-10-CM | POA: Diagnosis not present

## 2020-12-02 DIAGNOSIS — S61452A Open bite of left hand, initial encounter: Secondary | ICD-10-CM | POA: Diagnosis not present

## 2020-12-02 DIAGNOSIS — S31159A Open bite of abdominal wall, unspecified quadrant without penetration into peritoneal cavity, initial encounter: Secondary | ICD-10-CM | POA: Diagnosis not present

## 2020-12-02 DIAGNOSIS — S61451A Open bite of right hand, initial encounter: Secondary | ICD-10-CM | POA: Diagnosis not present

## 2020-12-02 DIAGNOSIS — S61552A Open bite of left wrist, initial encounter: Secondary | ICD-10-CM | POA: Diagnosis not present

## 2020-12-09 DIAGNOSIS — S31159A Open bite of abdominal wall, unspecified quadrant without penetration into peritoneal cavity, initial encounter: Secondary | ICD-10-CM | POA: Diagnosis not present

## 2020-12-09 DIAGNOSIS — Z6825 Body mass index (BMI) 25.0-25.9, adult: Secondary | ICD-10-CM | POA: Diagnosis not present

## 2020-12-09 DIAGNOSIS — S51859A Open bite of unspecified forearm, initial encounter: Secondary | ICD-10-CM | POA: Diagnosis not present

## 2020-12-09 DIAGNOSIS — W540XXA Bitten by dog, initial encounter: Secondary | ICD-10-CM | POA: Diagnosis not present

## 2020-12-16 ENCOUNTER — Other Ambulatory Visit: Payer: Self-pay | Admitting: Cardiology

## 2020-12-16 NOTE — Telephone Encounter (Signed)
Rx refill sent to pharmacy. 

## 2021-02-26 ENCOUNTER — Other Ambulatory Visit: Payer: Self-pay | Admitting: Cardiology

## 2021-02-27 IMAGING — CT CT ANGIOGRAPHY CHEST
2 of 6 series · 16 of 46 positions shown · IV contrast (APPLIED)
Comparison: March 29, 2018

CLINICAL DATA: Ascending thoracic aortic prominence

EXAM:
CT ANGIOGRAPHY CHEST WITH CONTRAST
TECHNIQUE: Multidetector CT imaging of the chest was performed using the
standard protocol during bolus administration of intravenous
contrast. Multiplanar CT image reconstructions and MIPs were
obtained to evaluate the vascular anatomy.
CONTRAST:  100mL OMNIPAQUE IOHEXOL 350 MG/ML SOLN

[Series 4: axial arterial · axial · arterial · 0.82mm/px · z∈[-274,-55]mm · 13 of 83 slices shown]
[im 5/83  lung]
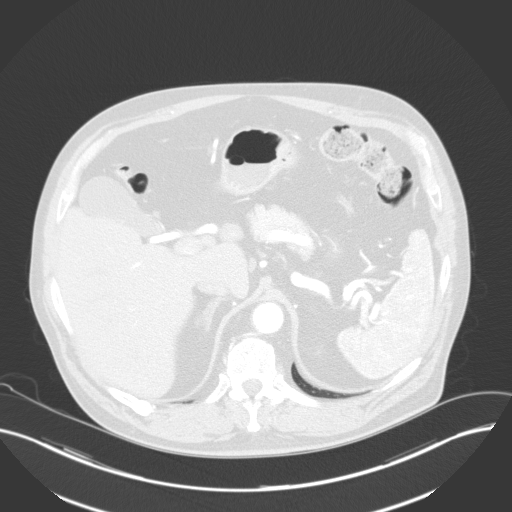
[im 13/83  soft-tissue]
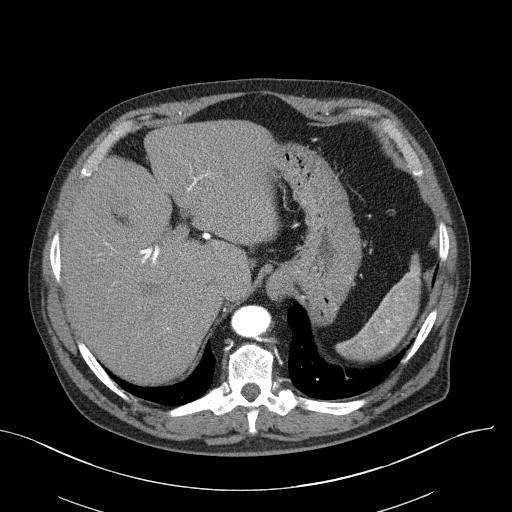
[im 18/83  lung]
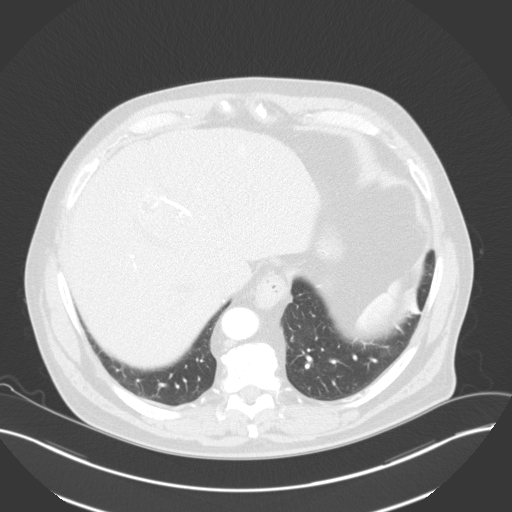
[im 22/83  soft-tissue]
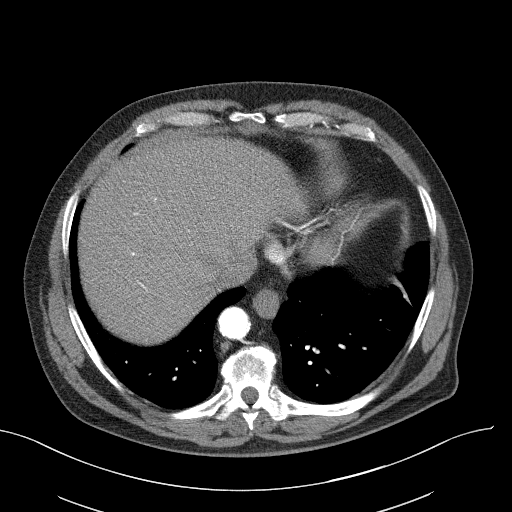
[im 31/83  lung]
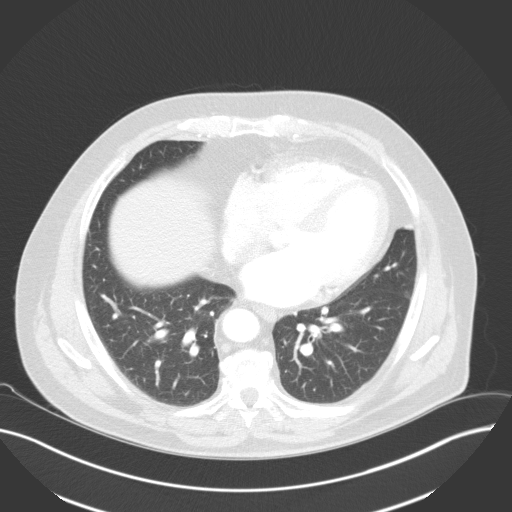
[im 35/83  soft-tissue]
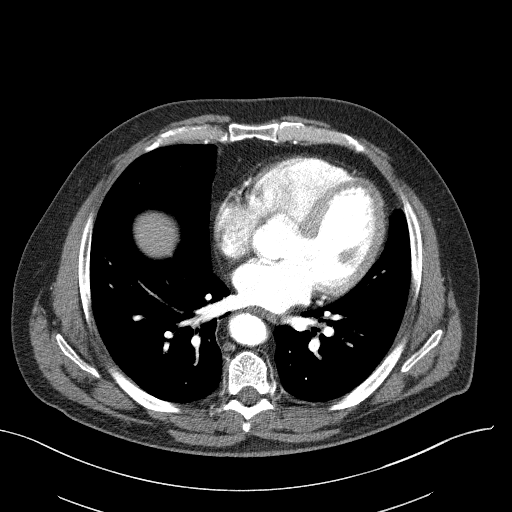
[im 44/83  lung]
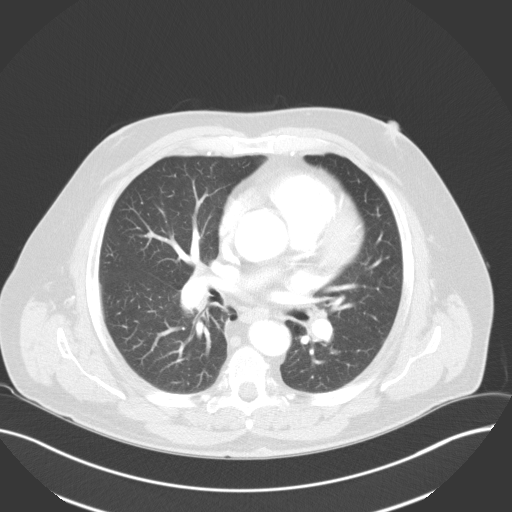
[im 48/83  soft-tissue]
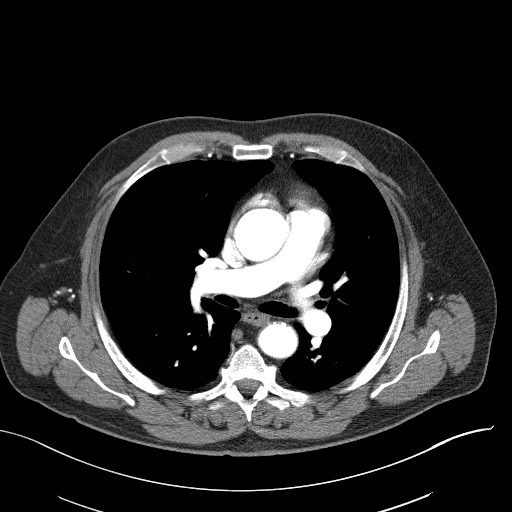
[im 52/83  lung]
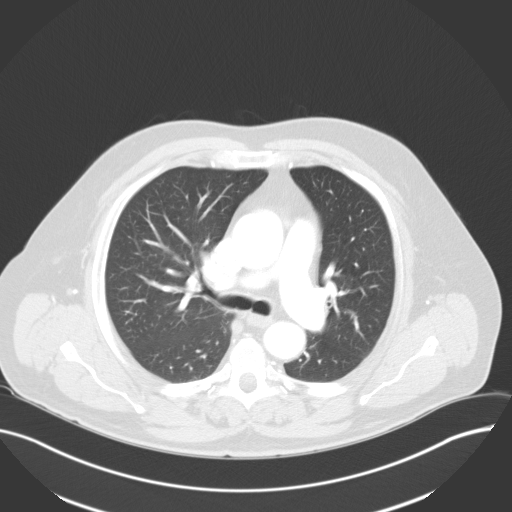
[im 61/83  soft-tissue]
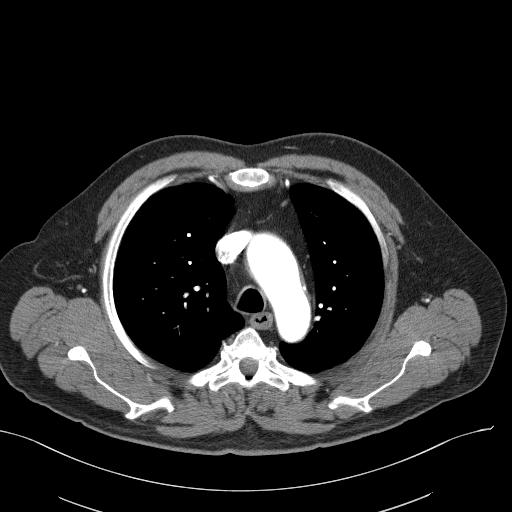
[im 65/83  lung]
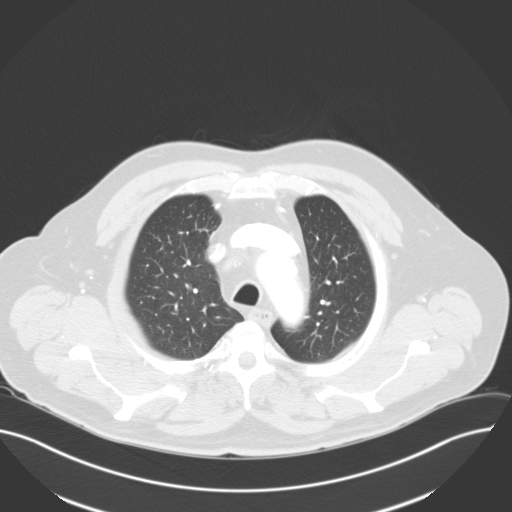
[im 70/83  soft-tissue]
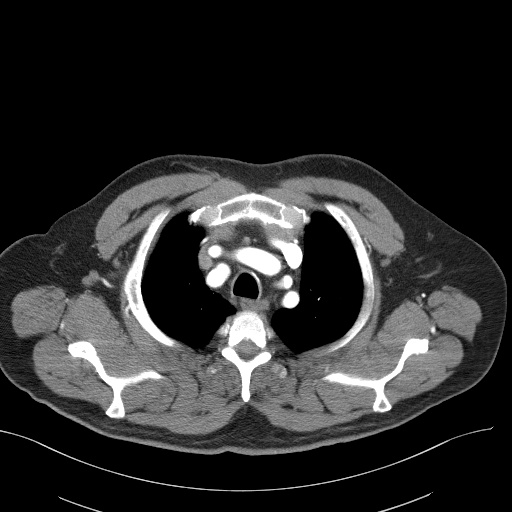
[im 78/83  lung]
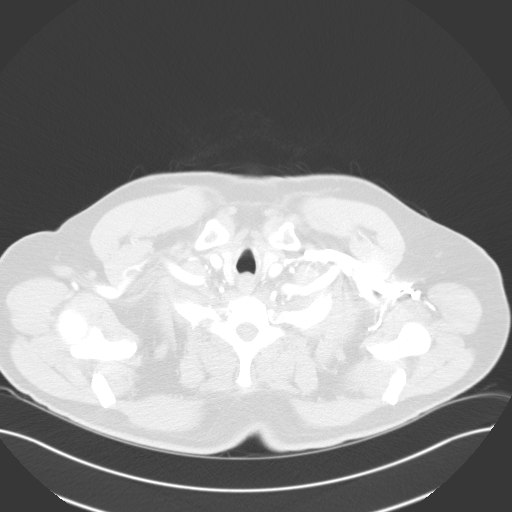

[Series 6: coronal · coronal · 0.51mm/px · 3 of 102 slices shown]
[im 26/102  soft-tissue]
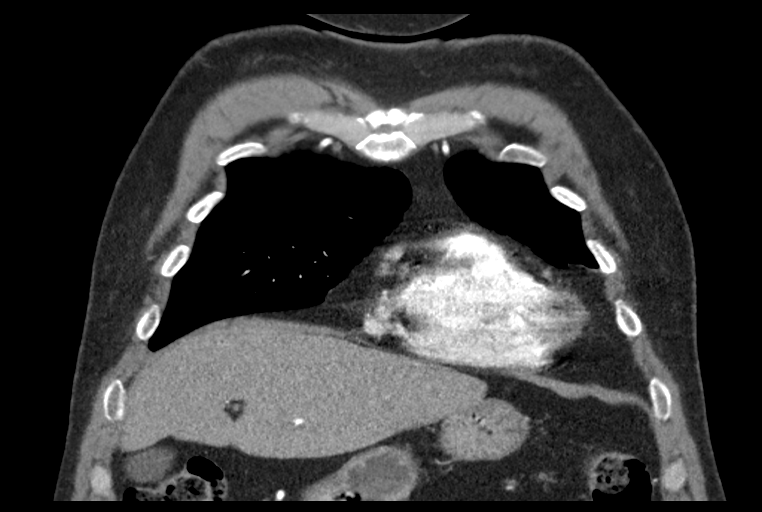
[im 51/102  soft-tissue]
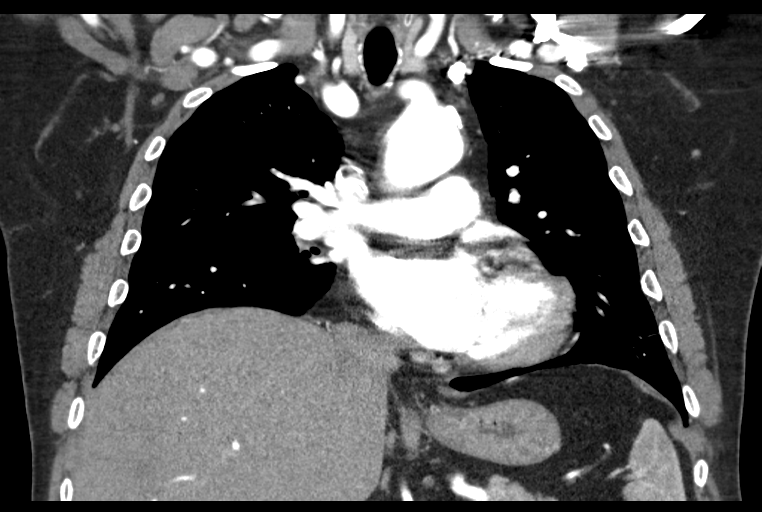
[im 76/102  soft-tissue]
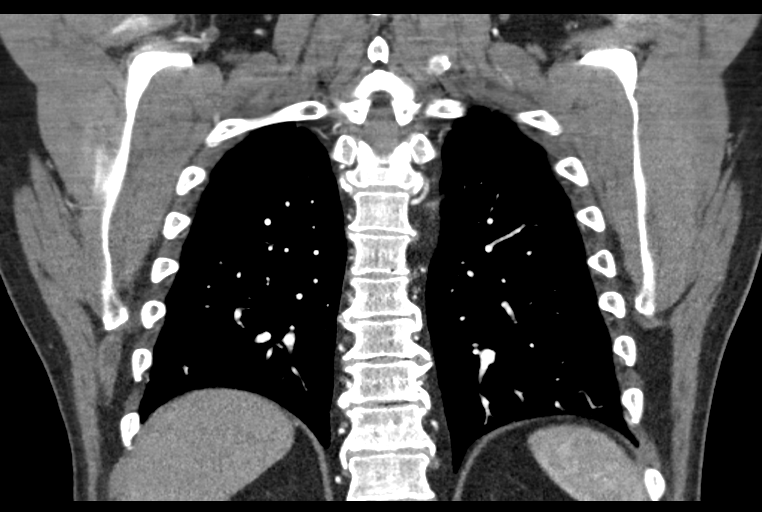

[16 of 46 positions shown; findings below may reference images not displayed]

FINDINGS: Cardiovascular: Ascending thoracic aortic diameter measures 4.4 x
4.4 cm, stable from prior study. No evident dissection. Thoracic
diameter at the arch measures 3.7 cm, likewise stable. Measured
diameter at the sinotubular junction, measured on coronal images
cm. Mid descending thoracic aortic diameter measures 3.2 x 3.0 cm.
Visualized great vessels appear somewhat tortuous. There are
scattered foci of patchy calcification in the proximal visualized
great vessels without hemodynamically significant obstruction
evident. There are foci of aortic atherosclerosis. There is slight
coronary artery calcification. There is no pericardial effusion or
pericardial thickening. There is no demonstrable pulmonary embolus.

Mediastinum/Nodes: Thyroid appears unremarkable. There is no
appreciable thoracic adenopathy. There is a small hiatal hernia.

Lungs/Pleura: There is mild scarring in the lateral left base. There
is no evident edema or consolidation. There is a stable nodular
opacity in the lateral segment of the right middle lobe measuring 4
mm. There is no appreciable pleural effusion or pleural thickening.

Upper Abdomen: There is upper abdominal aortic atherosclerosis.
There is a calcified granuloma in the spleen. Visualized upper
abdominal structures otherwise appear unremarkable.

Musculoskeletal: There is degenerative change in the thoracic spine.
There are no blastic or lytic bone lesions. No chest wall lesions
are demonstrable.

Review of the MIP images confirms the above findings.
IMPRESSION: 1. Stable ascending thoracic aortic diameter, measured at 4.4 x
cm. No evident dissection. Recommend annual imaging followup by CTA
or MRA. This recommendation follows 9858
ACCF/AHA/AATS/ACR/ASA/SCA/HARTE/TAGALDEEN/MAGGA/RAUTENBERG Guidelines for the
Diagnosis and Management of Patients with Thoracic Aortic Disease.
Circulation. 9858; 121: [id number]. Aortic aneurysm NOS
(4ZI7U-AGX.H). There are foci of aortic atherosclerosis as well as
foci of coronary artery and great vessel calcification.

2.  No demonstrable pulmonary embolus.

3. No edema or consolidation. Stable 4 mm nodular opacity right
middle lobe. Mild scarring left base.

4.  No demonstrable thoracic adenopathy.

5.  Small hiatal hernia.

Aortic Atherosclerosis (4ZI7U-WWQ.Q).

## 2021-03-02 DIAGNOSIS — Z79899 Other long term (current) drug therapy: Secondary | ICD-10-CM | POA: Diagnosis not present

## 2021-03-02 DIAGNOSIS — Z6823 Body mass index (BMI) 23.0-23.9, adult: Secondary | ICD-10-CM | POA: Diagnosis not present

## 2021-03-02 DIAGNOSIS — E1143 Type 2 diabetes mellitus with diabetic autonomic (poly)neuropathy: Secondary | ICD-10-CM | POA: Diagnosis not present

## 2021-03-02 DIAGNOSIS — R634 Abnormal weight loss: Secondary | ICD-10-CM | POA: Diagnosis not present

## 2021-03-02 DIAGNOSIS — I10 Essential (primary) hypertension: Secondary | ICD-10-CM | POA: Diagnosis not present

## 2021-03-02 DIAGNOSIS — E7800 Pure hypercholesterolemia, unspecified: Secondary | ICD-10-CM | POA: Diagnosis not present

## 2021-03-02 DIAGNOSIS — M1990 Unspecified osteoarthritis, unspecified site: Secondary | ICD-10-CM | POA: Diagnosis not present

## 2021-03-08 DIAGNOSIS — I712 Thoracic aortic aneurysm, without rupture: Secondary | ICD-10-CM | POA: Diagnosis not present

## 2021-03-08 DIAGNOSIS — R634 Abnormal weight loss: Secondary | ICD-10-CM | POA: Diagnosis not present

## 2021-03-08 DIAGNOSIS — R0789 Other chest pain: Secondary | ICD-10-CM | POA: Diagnosis not present

## 2021-03-08 DIAGNOSIS — I700 Atherosclerosis of aorta: Secondary | ICD-10-CM | POA: Diagnosis not present

## 2021-03-08 DIAGNOSIS — K8020 Calculus of gallbladder without cholecystitis without obstruction: Secondary | ICD-10-CM | POA: Diagnosis not present

## 2021-03-08 DIAGNOSIS — K5750 Diverticulosis of both small and large intestine without perforation or abscess without bleeding: Secondary | ICD-10-CM | POA: Diagnosis not present

## 2021-03-08 DIAGNOSIS — I2510 Atherosclerotic heart disease of native coronary artery without angina pectoris: Secondary | ICD-10-CM | POA: Diagnosis not present

## 2021-03-18 DIAGNOSIS — R160 Hepatomegaly, not elsewhere classified: Secondary | ICD-10-CM | POA: Diagnosis not present

## 2021-03-18 DIAGNOSIS — C221 Intrahepatic bile duct carcinoma: Secondary | ICD-10-CM | POA: Diagnosis not present

## 2021-03-18 DIAGNOSIS — C229 Malignant neoplasm of liver, not specified as primary or secondary: Secondary | ICD-10-CM | POA: Diagnosis not present

## 2021-03-25 ENCOUNTER — Telehealth: Payer: Self-pay | Admitting: Oncology

## 2021-03-25 DIAGNOSIS — Z6824 Body mass index (BMI) 24.0-24.9, adult: Secondary | ICD-10-CM | POA: Diagnosis not present

## 2021-03-25 DIAGNOSIS — D638 Anemia in other chronic diseases classified elsewhere: Secondary | ICD-10-CM | POA: Diagnosis not present

## 2021-03-25 DIAGNOSIS — M1990 Unspecified osteoarthritis, unspecified site: Secondary | ICD-10-CM | POA: Diagnosis not present

## 2021-03-25 DIAGNOSIS — E1143 Type 2 diabetes mellitus with diabetic autonomic (poly)neuropathy: Secondary | ICD-10-CM | POA: Diagnosis not present

## 2021-03-25 DIAGNOSIS — Z8505 Personal history of malignant neoplasm of liver: Secondary | ICD-10-CM | POA: Diagnosis not present

## 2021-03-25 NOTE — Telephone Encounter (Signed)
Patient referred by Dr Lovette Cliche for 04/06/21 Labs 10:30 am - Consult 11:00 am

## 2021-04-05 ENCOUNTER — Other Ambulatory Visit: Payer: Self-pay | Admitting: Oncology

## 2021-04-05 DIAGNOSIS — K219 Gastro-esophageal reflux disease without esophagitis: Secondary | ICD-10-CM | POA: Insufficient documentation

## 2021-04-05 DIAGNOSIS — C221 Intrahepatic bile duct carcinoma: Secondary | ICD-10-CM

## 2021-04-05 DIAGNOSIS — E785 Hyperlipidemia, unspecified: Secondary | ICD-10-CM | POA: Insufficient documentation

## 2021-04-05 DIAGNOSIS — I2510 Atherosclerotic heart disease of native coronary artery without angina pectoris: Secondary | ICD-10-CM | POA: Insufficient documentation

## 2021-04-05 DIAGNOSIS — I10 Essential (primary) hypertension: Secondary | ICD-10-CM | POA: Insufficient documentation

## 2021-04-05 DIAGNOSIS — N189 Chronic kidney disease, unspecified: Secondary | ICD-10-CM | POA: Insufficient documentation

## 2021-04-05 DIAGNOSIS — E119 Type 2 diabetes mellitus without complications: Secondary | ICD-10-CM | POA: Insufficient documentation

## 2021-04-05 DIAGNOSIS — C787 Secondary malignant neoplasm of liver and intrahepatic bile duct: Secondary | ICD-10-CM

## 2021-04-06 ENCOUNTER — Inpatient Hospital Stay: Payer: Medicare PPO | Admitting: Oncology

## 2021-04-06 ENCOUNTER — Other Ambulatory Visit: Payer: Self-pay | Admitting: Oncology

## 2021-04-06 ENCOUNTER — Encounter: Payer: Self-pay | Admitting: Oncology

## 2021-04-06 ENCOUNTER — Inpatient Hospital Stay: Payer: Medicare PPO | Attending: Oncology

## 2021-04-06 ENCOUNTER — Other Ambulatory Visit: Payer: Self-pay

## 2021-04-06 DIAGNOSIS — C221 Intrahepatic bile duct carcinoma: Secondary | ICD-10-CM

## 2021-04-06 DIAGNOSIS — Z803 Family history of malignant neoplasm of breast: Secondary | ICD-10-CM

## 2021-04-06 DIAGNOSIS — Z87891 Personal history of nicotine dependence: Secondary | ICD-10-CM

## 2021-04-06 DIAGNOSIS — C787 Secondary malignant neoplasm of liver and intrahepatic bile duct: Secondary | ICD-10-CM | POA: Insufficient documentation

## 2021-04-06 DIAGNOSIS — D649 Anemia, unspecified: Secondary | ICD-10-CM | POA: Diagnosis not present

## 2021-04-06 DIAGNOSIS — Z800 Family history of malignant neoplasm of digestive organs: Secondary | ICD-10-CM

## 2021-04-06 LAB — BASIC METABOLIC PANEL
BUN: 17 (ref 4–21)
CO2: 28 — AB (ref 13–22)
Chloride: 99 (ref 99–108)
Creatinine: 1.1 (ref 0.6–1.3)
Glucose: 198
Potassium: 4.5 (ref 3.4–5.3)
Sodium: 134 — AB (ref 137–147)

## 2021-04-06 LAB — HEPATIC FUNCTION PANEL
ALT: 24 (ref 10–40)
AST: 30 (ref 14–40)
Alkaline Phosphatase: 88 (ref 25–125)
Bilirubin, Total: 0.6

## 2021-04-06 LAB — CBC: RBC: 4.03 (ref 3.87–5.11)

## 2021-04-06 LAB — COMPREHENSIVE METABOLIC PANEL
Albumin: 4.3 (ref 3.5–5.0)
Calcium: 9 (ref 8.7–10.7)

## 2021-04-06 LAB — CBC AND DIFFERENTIAL
HCT: 37 — AB (ref 41–53)
Hemoglobin: 12.5 — AB (ref 13.5–17.5)
Neutrophils Absolute: 3.82
Platelets: 164 (ref 150–399)
WBC: 5.3

## 2021-04-06 MED ORDER — HYDROCODONE-ACETAMINOPHEN 10-325 MG PO TABS: 1.0000 | ORAL_TABLET | Freq: Four times a day (QID) | ORAL | 0 refills | Status: DC | PRN

## 2021-04-06 NOTE — Progress Notes (Signed)
START OFF PATHWAY REGIMEN - Other   [id number]:Cisplatin 80 mg/m2 IV D1 + Gemcitabine 1,000 mg/m2 IV D1,8 q21 Days:   A cycle is every 21 days:     Gemcitabine      Cisplatin   **Always confirm dose/schedule in your pharmacy ordering system**  Patient Characteristics: Intent of Therapy: Non-Curative / Palliative Intent, Discussed with Patient

## 2021-04-06 NOTE — Progress Notes (Signed)
[place]  [address] [place],  [place]  [address] [phone]  Clinic Day:  04/06/2021  Referring physician: Angelina Sheriff, MD   HISTORY OF PRESENT ILLNESS:  The patient is a 70 y.o. male  who I was asked to consult upon for what clinically appears to be cholangiocarcinoma.  The patient claims he has lost 50-55 pounds over the past year.  Over the past few months, he has also had right upper abdominal pain that radiated around to his back.  These findings/symptoms led to the patient requesting scans for further evaluation.  In May 2022, CT scans of his chest/abdomen/pelvis revealed lesions in his liver that were highly concerning for a malignancy being present.  The largest lesion measured 8.1 x 7.3 cm.  There was also porta hepatic, gastrohepatic, and pericardiophrenic lymphadenopathy that was suspicious for nodal spread.  These findings led to him undergoing an ultrasound-guided liver biopsy, whose pathology revealed adenocarcinoma. Based upon scans and his immunohistochemical staining pattern, the origin of his cancer appeared to be bile duct in nature.  He comes in today to go over his biopsy and scan results, as well as their implications.    PAST MEDICAL HISTORY:   Past Medical History:  Diagnosis Date  . Arthritis   . Cancer (HCC)    LIVER  . Chronic kidney disease   . Coronary atherosclerosis of native coronary artery   . Diabetes mellitus without complication ([place])   . GERD (gastroesophageal reflux disease)   . Hyperlipidemia   . Hypertension   . Stroke [facility])     PAST SURGICAL HISTORY:   Past Surgical History:  Procedure Laterality Date  . ADENOIDECTOMY    . BICEPS TENDON REPAIR Right   . CYSTECTOMY    . HERNIA REPAIR    . KNEE ARTHROSCOPY Right   . MOUTH SURGERY    . ROTATOR CUFF REPAIR Right   . SHOULDER ARTHROSCOPY Right   . TONSILLECTOMY    . VASECTOMY      CURRENT MEDICATIONS:   Current Outpatient Medications   Medication Sig Dispense Refill  . amLODipine (NORVASC) 10 MG tablet 10 mg daily.    Marland Kitchen aspirin (GOODSENSE ASPIRIN) 325 MG tablet Take 165 mg by mouth daily.    Marland Kitchen atorvastatin (LIPITOR) 40 MG tablet Take 1 tablet by mouth once daily 90 tablet 1  . cholecalciferol (VITAMIN D3) 25 MCG (1000 UT) tablet Take 1,000 Units by mouth daily.    . cloNIDine (CATAPRES) 0.1 MG tablet Take 0.1 mg by mouth 2 (two) times daily.    . Ferrous Sulfate (IRON) 28 MG TABS Take 1 tablet by mouth daily.    . furosemide (LASIX) 20 MG tablet Take 20 mg by mouth daily.     Marland Kitchen gabapentin (NEURONTIN) 300 MG capsule Take 300 mg by mouth daily.     . isosorbide mononitrate (IMDUR) 30 MG 24 hr tablet Take 1 tablet by mouth once daily 30 tablet 6  . Multiple Vitamin (MULTIVITAMIN) capsule Take 1 capsule by mouth daily.    . nitroGLYCERIN (NITROSTAT) 0.4 MG SL tablet DISSOLVE 1 TABLET UNDER THE TONGUE EVERY 5 MINUTES AS NEEDED FOR CHEST PAIN 25 tablet 0  . Omega-3 Fatty Acids (FISH OIL) 1000 MG CAPS Take 1,000 mg by mouth 2 (two) times daily.    Marland Kitchen OZEMPIC, 0.25 OR 0.5 MG/DOSE, 2 MG/1.5ML SOPN     . pantoprazole (PROTONIX) 40 MG tablet Take 40 mg by mouth daily.     Marland Kitchen  ranolazine (RANEXA) 500 MG 12 hr tablet Take 1 tablet by mouth twice daily 180 tablet 1  . traZODone (DESYREL) 50 MG tablet Take 50 mg by mouth daily.    . vitamin B-12 (CYANOCOBALAMIN) 500 MCG tablet Take 1 tablet by mouth daily.    . vitamin C (ASCORBIC ACID) 500 MG tablet Take 500 mg by mouth daily.     No current facility-administered medications for this visit.    ALLERGIES:   Allergies  Allergen Reactions  . Sulfa Antibiotics Swelling  . Escitalopram Oxalate Other (See Comments)    intolerance  . Hydrochlorothiazide Other (See Comments)    Likely contributed to renal insufficiency  . Ibuprofen Other (See Comments)    ask  . Lisinopril Cough    FAMILY HISTORY:   Family History  Problem Relation Age of Onset  . Breast cancer Mother   .  Diabetes Mother   . Colon cancer Sister   . Breast cancer Sister   . Heart attack Father   . Breast cancer Sister   . Colon cancer Sister     SOCIAL HISTORY:  The patient was born and raised in [place].  [place] in the [place].  He is divorced, with 6 children, 15 grandchildren and 3 great grandchildren.  He was a English as a second language teacher for 43 years.  He briefly smoked cigarettes but has not done so since the 1970's.  He drinks alcohol on rare occasions.    REVIEW OF SYSTEMS:  Review of Systems  Constitutional: Positive for fatigue and unexpected weight change. Negative for fever.  Respiratory: Positive for cough. Negative for chest tightness, hemoptysis and shortness of breath.   Cardiovascular: Negative for chest pain and palpitations.  Gastrointestinal: Positive for vomiting. Negative for abdominal distention, abdominal pain, blood in stool, constipation, diarrhea and nausea.  Genitourinary: Negative for dysuria, frequency and hematuria.   Musculoskeletal: Positive for arthralgias and gait problem (suboptimal balance). Negative for back pain and myalgias.  Skin: Negative for itching and rash.  Neurological: Positive for gait problem (suboptimal balance). Negative for dizziness, headaches and light-headedness.  Psychiatric/Behavioral: Negative for depression and suicidal ideas. The patient is not nervous/anxious.      PHYSICAL EXAM:  Blood pressure (!) 154/72, pulse (!) 50, temperature 98 F (36.7 C), resp. rate 16, height 5\' 8"  (1.727 m), weight 161 lb 12.8 oz (73.4 kg), SpO2 99 %. Wt Readings from Last 3 Encounters:  04/06/21 161 lb 12.8 oz (73.4 kg)  09/08/20 166 lb (75.3 kg)  03/12/20 203 lb (92.1 kg)   Body mass index is 24.6 kg/m. Performance status (ECOG): 1 - Symptomatic but completely ambulatory Physical Exam Constitutional:      Appearance: Normal appearance. He is not ill-appearing.  HENT:     Mouth/Throat:     Mouth: Mucous membranes are moist.      Pharynx: Oropharynx is clear. No oropharyngeal exudate or posterior oropharyngeal erythema.  Cardiovascular:     Rate and Rhythm: Normal rate and regular rhythm.     Heart sounds: No murmur heard. No friction rub. No gallop.   Pulmonary:     Effort: Pulmonary effort is normal. No respiratory distress.     Breath sounds: Normal breath sounds. No wheezing, rhonchi or rales.  Chest:  Breasts:     Right: No axillary adenopathy or supraclavicular adenopathy.     Left: No axillary adenopathy or supraclavicular adenopathy.    Abdominal:     General: Bowel sounds are normal. There is no distension.  Palpations: Abdomen is soft. There is no mass.     Tenderness: There is no abdominal tenderness.  Musculoskeletal:        General: No swelling.     Right lower leg: No edema.     Left lower leg: No edema.  Lymphadenopathy:     Cervical: No cervical adenopathy.     Upper Body:     Right upper body: No supraclavicular or axillary adenopathy.     Left upper body: No supraclavicular or axillary adenopathy.     Lower Body: No right inguinal adenopathy. No left inguinal adenopathy.  Skin:    General: Skin is warm.     Coloration: Skin is not jaundiced.     Findings: No lesion or rash.  Neurological:     General: No focal deficit present.     Mental Status: He is alert and oriented to person, place, and time. Mental status is at baseline.     Cranial Nerves: Cranial nerves are intact.  Psychiatric:        Mood and Affect: Mood normal.        Behavior: Behavior normal.        Thought Content: Thought content normal.    LABS:   CBC Latest Ref Rng & Units 04/06/2021  WBC - 5.3  Hemoglobin 13.5 - 17.5 12.5(A)  Hematocrit 41 - 53 37(A)  Platelets 150 - 399 164   CMP Latest Ref Rng & Units 04/06/2021 10/16/2020 03/18/2019  Glucose 65 - 99 mg/dL - - 135(H)  BUN 4 - 21 17 - 18  Creatinine 0.6 - 1.3 1.1 - 1.41(H)  Sodium 137 - 147 134(A) - 137  Potassium 3.4 - 5.3 4.5 - 4.4  Chloride 99 -  108 99 - 100  CO2 13 - 22 28(A) - 24  Calcium 8.7 - 10.7 9.0 - 9.5  Alkaline Phos 25 - 125 88 - -  AST 14 - 40 30 18 -  ALT 10 - 40 24 18 -    ASSESSMENT & PLAN:  A 70 y.o. male who I was asked to consult upon for stage IIIB (T2 N1 M0) cholangiocarcinoma.   In clinic today I went over his CT scan images with him, for which he could see the multiple liver lesions.  Despite this, less than 10% of his liver is involved with cancer.  Although I believe his disease is incurable, my hopes are it can be treated and kept under control for a prolonged period of time.  I will start this gentleman on cisplatin (day 1) /gemcitabine (days 1 and 8), with each cycle being repeated every 3 weeks.  The patient was made aware of the side effects that go along with each regimen, including nausea/vomitting, cytopenias, and fatigue.  I will arrange for him to have a port placed, through which all of his chemotherapy will be given.  I will also have his tumor sent out for testing to see if his cancer would be amenable to some form of targeted therapy in the future.  His first cycle of cisplatin/gemcitabine will commence on June 15th.  I will see him back 3 weeks later before he heads into his 2nd cycle of palliative chemotherapy.  Although despondent, the patient and his daughter understand all the plans discussed today and are in agreement with them.  I do appreciate Angelina Sheriff, MD for his new consult.   Natasha Burda Macarthur Critchley, MD

## 2021-04-08 ENCOUNTER — Other Ambulatory Visit: Payer: Self-pay | Admitting: Cardiology

## 2021-04-08 ENCOUNTER — Ambulatory Visit: Payer: Medicare PPO | Admitting: Cardiology

## 2021-04-08 ENCOUNTER — Other Ambulatory Visit: Payer: Self-pay

## 2021-04-08 ENCOUNTER — Encounter: Payer: Self-pay | Admitting: Oncology

## 2021-04-08 ENCOUNTER — Encounter: Payer: Self-pay | Admitting: Cardiology

## 2021-04-08 VITALS — BP 140/60 | HR 53 | Ht 68.0 in | Wt 162.0 lb

## 2021-04-08 DIAGNOSIS — C787 Secondary malignant neoplasm of liver and intrahepatic bile duct: Secondary | ICD-10-CM

## 2021-04-08 DIAGNOSIS — I209 Angina pectoris, unspecified: Secondary | ICD-10-CM

## 2021-04-08 DIAGNOSIS — I2510 Atherosclerotic heart disease of native coronary artery without angina pectoris: Secondary | ICD-10-CM

## 2021-04-08 DIAGNOSIS — I10 Essential (primary) hypertension: Secondary | ICD-10-CM | POA: Diagnosis not present

## 2021-04-08 DIAGNOSIS — C221 Intrahepatic bile duct carcinoma: Secondary | ICD-10-CM | POA: Diagnosis not present

## 2021-04-08 DIAGNOSIS — E785 Hyperlipidemia, unspecified: Secondary | ICD-10-CM | POA: Diagnosis not present

## 2021-04-08 DIAGNOSIS — I7789 Other specified disorders of arteries and arterioles: Secondary | ICD-10-CM

## 2021-04-08 NOTE — Patient Instructions (Signed)

## 2021-04-08 NOTE — Progress Notes (Signed)
Cardiology Office Note:    Date:  04/08/2021   ID:  Nathan Sloan, DOB 1950-12-12, MRN [id number]  PCP:  Angelina Sheriff, MD  Cardiologist:  Jenne Campus, MD    Referring MD: Angelina Sheriff, MD   Chief Complaint  Patient presents with   Follow-up  I have liver cancer  History of Present Illness:    Nathan Sloan is a 70 y.o. male with past medical history significant for remote coronary artery disease, essential hypertension, ascending aorta count was measuring 46 mm based on echocardiogram from 2021.  Recently he started losing weight initially he said it was related to diet and exercises however weight loss continue and eventually testing were initiated he was find to have adenocarcinoma of his liver with most likely metastasis to lymph nodes.  He is being seen by oncology for it and he is going to initiate chemotherapy.  He was told this is incurable problem but with proper chemotherapy therapy he can have his life prolonged.  He decided to proceed with chemotherapy.  Cardiac wise he is doing well.  Denies have any chest pain tightness squeezing pressure burning chest no palpitations no dizziness in the matter-of-fact few days ago he was able to cut grass in his garden  Past Medical History:  Diagnosis Date   Arthritis    Cancer ([place])    LIVER   Chronic kidney disease    Coronary atherosclerosis of native coronary artery    Diabetes mellitus without complication (HCC)    GERD (gastroesophageal reflux disease)    Hyperlipidemia    Hypertension    Stroke [facility])     Past Surgical History:  Procedure Laterality Date   ADENOIDECTOMY     BICEPS TENDON REPAIR Right    CYSTECTOMY     HERNIA REPAIR     KNEE ARTHROSCOPY Right    MOUTH SURGERY     ROTATOR CUFF REPAIR Right    SHOULDER ARTHROSCOPY Right    TONSILLECTOMY     VASECTOMY      Current Medications: Current Meds  Medication Sig   amLODipine (NORVASC) 10 MG tablet Take 10 mg by mouth daily.    cholecalciferol (VITAMIN D3) 25 MCG (1000 UT) tablet Take 1,000 Units by mouth daily.   cloNIDine (CATAPRES) 0.1 MG tablet Take 0.1 mg by mouth 2 (two) times daily.   Ferrous Sulfate (IRON) 28 MG TABS Take 1 tablet by mouth daily.   furosemide (LASIX) 20 MG tablet Take 20 mg by mouth daily.    gabapentin (NEURONTIN) 300 MG capsule Take 300 mg by mouth daily.    isosorbide mononitrate (IMDUR) 30 MG 24 hr tablet Take 1 tablet by mouth once daily (Patient taking differently: Take 30 mg by mouth daily.)   Multiple Vitamin (MULTIVITAMIN) capsule Take 1 capsule by mouth daily. Unknown strenght   nitroGLYCERIN (NITROSTAT) 0.4 MG SL tablet DISSOLVE 1 TABLET UNDER THE TONGUE EVERY 5 MINUTES AS NEEDED FOR CHEST PAIN (Patient taking differently: Place 0.4 mg under the tongue every 5 (five) minutes as needed for chest pain.)   Omega-3 Fatty Acids (FISH OIL) 1000 MG CAPS Take 1,000 mg by mouth 2 (two) times daily.   pantoprazole (PROTONIX) 40 MG tablet Take 40 mg by mouth daily.    ranolazine (RANEXA) 500 MG 12 hr tablet Take 1 tablet by mouth twice daily (Patient taking differently: Take 500 mg by mouth 2 (two) times daily. TAKE 1 TABLET BY MOUTH TWICE DAILY,)   traZODone (DESYREL) 50 MG  tablet Take 50 mg by mouth daily.   vitamin B-12 (CYANOCOBALAMIN) 500 MCG tablet Take 1 tablet by mouth daily.   vitamin C (ASCORBIC ACID) 500 MG tablet Take 500 mg by mouth daily.     Allergies:   Sulfa antibiotics, Escitalopram oxalate, Hydrochlorothiazide, Ibuprofen, and Lisinopril   Social History   Socioeconomic History   Marital status: Divorced    Spouse name: Not on file   Number of children: 7   Years of education: 7   Highest education level: Not on file  Occupational History   Occupation: RETIRED    Comment: MEAT CUTTER  Tobacco Use   Smoking status: Never   Smokeless tobacco: Never  Vaping Use   Vaping Use: Never used  Substance and Sexual Activity   Alcohol use: No   Drug use: Yes    Types:  Marijuana   Sexual activity: Not Currently  Other Topics Concern   Not on file  Social History Narrative   Not on file   Social Determinants of Health   Financial Resource Strain: Not on file  Food Insecurity: Not on file  Transportation Needs: Not on file  Physical Activity: Not on file  Stress: Not on file  Social Connections: Not on file     Family History: The patient's family history includes Breast cancer in his mother, sister, and sister; Colon cancer in his sister and sister; Diabetes in his mother; Heart attack in his father. ROS:   Please see the history of present illness.    All 14 point review of systems negative except as described per history of present illness  EKGs/Labs/Other Studies Reviewed:      Recent Labs: 04/06/2021: ALT 24; BUN 17; Creatinine 1.1; Hemoglobin 12.5; Platelets 164; Potassium 4.5; Sodium 134  Recent Lipid Panel    Component Value Date/Time   CHOL 151 10/16/2020 0933   TRIG 102 10/16/2020 0933   HDL 51 10/16/2020 0933   CHOLHDL 3.0 10/16/2020 0933   LDLCALC 81 10/16/2020 0933    Physical Exam:    VS:  BP 140/60 (BP Location: Right Arm, Patient Position: Sitting)   Pulse (!) 53   Ht 5\' 8"  (1.727 m)   Wt 162 lb (73.5 kg)   SpO2 96%   BMI 24.63 kg/m     Wt Readings from Last 3 Encounters:  04/08/21 162 lb (73.5 kg)  04/06/21 161 lb 12.8 oz (73.4 kg)  09/08/20 166 lb (75.3 kg)     GEN:  Well nourished, well developed in no acute distress HEENT: Normal NECK: No JVD; No carotid bruits LYMPHATICS: No lymphadenopathy CARDIAC: RRR, no murmurs, no rubs, no gallops RESPIRATORY:  Clear to auscultation without rales, wheezing or rhonchi  ABDOMEN: Soft, non-tender, non-distended MUSCULOSKELETAL:  No edema; No deformity  SKIN: Warm and dry LOWER EXTREMITIES: no swelling NEUROLOGIC:  Alert and oriented x 3 PSYCHIATRIC:  Normal affect   ASSESSMENT:    1. Coronary artery disease involving native coronary artery of native heart  without angina pectoris   2. Essential hypertension   3. Cholangiocarcinoma metastatic to liver ([place])   4. Dyslipidemia   5. Aortic root enlargement (HCC)    PLAN:    In order of problems listed above:  Coronary disease stable from that point review we will continue present management. Essential hypertension slightly elevated today but he is emotional about the fact that he was diagnosed with metastatic liver cancer.  We will not alter any of his medications Dyslipidemia, he is on statin  and oncology team will follow up his liver function test. Aortic root enlargement.  We will consider repeated echocardiogram during next visit.  He does not want to do anything right now.  He is more concerned about his liver cancer. I did review note by oncologist for this visit   Medication Adjustments/Labs and Tests Ordered: Current medicines are reviewed at length with the patient today.  Concerns regarding medicines are outlined above.  No orders of the defined types were placed in this encounter.  Medication changes: No orders of the defined types were placed in this encounter.   Signed, Park Liter, MD, [facility] 04/08/2021 2:27 PM    [facility]

## 2021-04-09 ENCOUNTER — Telehealth: Payer: Self-pay

## 2021-04-09 ENCOUNTER — Telehealth: Payer: Self-pay | Admitting: Pharmacist

## 2021-04-09 NOTE — Telephone Encounter (Signed)
Pt called to make [place] aware, "I have changed my mind, I do not want chemo. I want to live the best I can, like I am right now. I have talked to several people, and I just don't want it".  I told pt that I would pass his decision to Dr Bobby Rumpf.

## 2021-04-09 NOTE — Telephone Encounter (Signed)
Per 6/7 Staff Msg, patient scheduled for 6/14 Labs, Patient Education - 6/21 Labs - 6/22 Infusions.  Patient has been notified of these scheduled Appt's

## 2021-04-12 ENCOUNTER — Other Ambulatory Visit: Payer: Self-pay | Admitting: Oncology

## 2021-04-12 ENCOUNTER — Encounter: Payer: Self-pay | Admitting: Oncology

## 2021-04-12 DIAGNOSIS — C229 Malignant neoplasm of liver, not specified as primary or secondary: Secondary | ICD-10-CM

## 2021-04-12 DIAGNOSIS — C801 Malignant (primary) neoplasm, unspecified: Secondary | ICD-10-CM

## 2021-04-13 ENCOUNTER — Other Ambulatory Visit: Payer: Medicare PPO | Admitting: Hematology and Oncology

## 2021-04-13 ENCOUNTER — Other Ambulatory Visit: Payer: Medicare PPO

## 2021-04-14 ENCOUNTER — Inpatient Hospital Stay: Payer: Medicare PPO

## 2021-04-19 ENCOUNTER — Telehealth: Payer: Self-pay

## 2021-04-20 ENCOUNTER — Other Ambulatory Visit: Payer: Medicare PPO

## 2021-04-21 ENCOUNTER — Ambulatory Visit: Payer: Medicare PPO

## 2021-04-21 DIAGNOSIS — C221 Intrahepatic bile duct carcinoma: Secondary | ICD-10-CM | POA: Diagnosis not present

## 2021-04-22 ENCOUNTER — Encounter: Payer: Self-pay | Admitting: *Deleted

## 2021-04-22 ENCOUNTER — Ambulatory Visit
Admission: RE | Admit: 2021-04-22 | Discharge: 2021-04-22 | Disposition: A | Discharge status: Home / Self Care | Payer: Medicare PPO | Source: Ambulatory Visit | Attending: Oncology | Admitting: Oncology

## 2021-04-22 ENCOUNTER — Other Ambulatory Visit: Payer: Self-pay

## 2021-04-22 DIAGNOSIS — C221 Intrahepatic bile duct carcinoma: Secondary | ICD-10-CM | POA: Diagnosis not present

## 2021-04-22 DIAGNOSIS — C229 Malignant neoplasm of liver, not specified as primary or secondary: Secondary | ICD-10-CM

## 2021-04-22 HISTORY — PX: IR RADIOLOGIST EVAL & MGMT: IMG5224

## 2021-04-22 NOTE — Consult Note (Signed)
Chief Complaint: Patient was seen in consultation today for intrahepatic cholangiocarcinoma  Referring Physician(s): Lewis,Dequincy A  History of Present Illness: Nathan Sloan is a 70 y.o. male presenting as a telephone consultation visit as a gracious referral from Dr. Bobby Rumpf for assistance in management of a large hepatic mass.  Due to significant weight loss and progressive right upper quadrant pain, the patient received a CT abdomen and pelvis on 03/08/21 which demonstrated an approximately 8 cm mass centered in segment IV, extending slightly into segments VIII and III.  There are a several porta hepatis and gastrohepatic prominent lymph nodes.  No evidence of distant metastases.  Ultrasound guided biopsy was performed on 03/18/21, with pathologic diagnosis of adenocarcinoma, favored of biliary etiology.  His case was discussed recently at the [facility].    Upon brief discussion of his overall diagnosis and treatment options, [name] requested to speak with his daughter about the possibility of pursuing this procedure, stating that overall he's not sure he was to undergo any treatment.  He has declined chemotherapy.  He stated understanding that our proposed procedure (radioembolization) is not considered curative, but performed to extend survival.    Past Medical History:  Diagnosis Date   Arthritis    Cancer ([place])    LIVER   Chronic kidney disease    Coronary atherosclerosis of native coronary artery    Diabetes mellitus without complication (HCC)    GERD (gastroesophageal reflux disease)    Hyperlipidemia    Hypertension    Stroke [facility])     Past Surgical History:  Procedure Laterality Date   ADENOIDECTOMY     BICEPS TENDON REPAIR Right    CYSTECTOMY     HERNIA REPAIR     KNEE ARTHROSCOPY Right    MOUTH SURGERY     ROTATOR CUFF REPAIR Right    SHOULDER ARTHROSCOPY Right    TONSILLECTOMY     VASECTOMY      Allergies: Sulfa antibiotics,  Escitalopram oxalate, Hydrochlorothiazide, Ibuprofen, and Lisinopril  Medications: Prior to Admission medications   Medication Sig Start Date End Date Taking? Authorizing Provider  amLODipine (NORVASC) 10 MG tablet Take 10 mg by mouth daily. 06/08/20   [provider]  cholecalciferol (VITAMIN D3) 25 MCG (1000 UT) tablet Take 1,000 Units by mouth daily.    [provider]  cloNIDine (CATAPRES) 0.1 MG tablet Take 0.1 mg by mouth 2 (two) times daily.    [provider]  Ferrous Sulfate (IRON) 28 MG TABS Take 1 tablet by mouth daily.    [provider]  furosemide (LASIX) 20 MG tablet Take 20 mg by mouth daily.  09/03/19   [provider]  gabapentin (NEURONTIN) 300 MG capsule Take 300 mg by mouth daily.  08/31/15   [provider]  isosorbide mononitrate (IMDUR) 30 MG 24 hr tablet Take 1 tablet by mouth once daily 04/08/21   Park Liter, MD  Multiple Vitamin (MULTIVITAMIN) capsule Take 1 capsule by mouth daily. Unknown strenght    [provider]  nitroGLYCERIN (NITROSTAT) 0.4 MG SL tablet DISSOLVE 1 TABLET UNDER THE TONGUE EVERY 5 MINUTES AS NEEDED FOR CHEST PAIN Patient taking differently: Place 0.4 mg under the tongue every 5 (five) minutes as needed for chest pain. 12/16/20   Park Liter, MD  Omega-3 Fatty Acids (FISH OIL) 1000 MG CAPS Take 1,000 mg by mouth 2 (two) times daily.    [provider]  pantoprazole (PROTONIX) 40 MG tablet Take 40  mg by mouth daily.  08/23/15   [provider]  ranolazine (RANEXA) 500 MG 12 hr tablet Take 1 tablet by mouth twice daily Patient taking differently: Take 500 mg by mouth 2 (two) times daily. TAKE 1 TABLET BY MOUTH TWICE DAILY, 02/26/21   Park Liter, MD  traZODone (DESYREL) 50 MG tablet Take 50 mg by mouth daily. 08/20/15   [provider]  vitamin B-12 (CYANOCOBALAMIN) 500 MCG tablet Take 1 tablet by mouth daily.    [provider]   vitamin C (ASCORBIC ACID) 500 MG tablet Take 500 mg by mouth daily.    [provider]     Family History  Problem Relation Age of Onset   Breast cancer Mother    Diabetes Mother    Colon cancer Sister    Breast cancer Sister    Heart attack Father    Breast cancer Sister    Colon cancer Sister     Social History   Socioeconomic History   Marital status: Divorced    Spouse name: Not on file   Number of children: 7   Years of education: 7   Highest education level: Not on file  Occupational History   Occupation: RETIRED    Comment: MEAT CUTTER  Tobacco Use   Smoking status: Never   Smokeless tobacco: Never  Vaping Use   Vaping Use: Never used  Substance and Sexual Activity   Alcohol use: No   Drug use: Yes    Types: Marijuana   Sexual activity: Not Currently  Other Topics Concern   Not on file  Social History Narrative   Not on file   Social Determinants of Health   Financial Resource Strain: Not on file  Food Insecurity: Not on file  Transportation Needs: Not on file  Physical Activity: Not on file  Stress: Not on file  Social Connections: Not on file    ECOG Status: 1 - Symptomatic but completely ambulatory  Review of Systems: A 12 point ROS discussed and pertinent positives are indicated in the HPI above.  All other systems are negative.   Vital Signs: There were no vitals taken for this visit.  No physical exam performed in lieu of telephone consultation.  Imaging: CT CAP 03/08/21      Labs:  CBC: Recent Labs    04/06/21 0000  WBC 5.3  HGB 12.5*  HCT 37*  PLT 164    COAGS: No results for input(s): INR, APTT in the last 8760 hours.  BMP: Recent Labs    04/06/21 0000  NA 134*  K 4.5  CL 99  CO2 28*  BUN 17  CALCIUM 9.0  CREATININE 1.1    LIVER FUNCTION TESTS: Recent Labs    10/16/20 0933 04/06/21 0000  AST 18 30  ALT 18 24  ALKPHOS  --  88  ALBUMIN  --  4.3     TUMOR MARKERS: No results for  input(s): AFPTM, CEA, CA199, CHROMGRNA in the last 8760 hours.  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  From an imaging and clinical standpoint, he would be an excellent candidate for transarterial radioembolization segmentectomy with Y-90, particularly  in terms of prolonged survival if combined with current prescribed chemotherapy regimen by Dr. Bobby Rumpf.  He wishes to discuss this with his daughter, and plans to call back if he decides he wants to proceed.  Edeline J, Touchefeu Y, Guiu B, et al.  Radioembolization Plus Chemotherapy for First-line Treatment of Locally Advanced Intrahepatic Cholangiocarcinoma: A Phase 2 Clinical Trial. JAMA Oncol. 2020;6(1):51-59. doi:10.1001/jamaoncol.2019.3702    Thank you for this interesting consult.  I greatly enjoyed meeting Krishna Dancel and look forward to participating in their care.  A copy of this report was sent to the requesting provider on this date.  Electronically Signed: Suzette Battiest, MD 04/22/2021, 2:54 PM   I spent a total of 30 Minutes in telephone clinical consultation, greater than 50% of which was counseling/coordinating care for cholangiocarcinoma.

## 2021-04-26 ENCOUNTER — Encounter: Payer: Self-pay | Admitting: Oncology

## 2021-04-26 NOTE — Telephone Encounter (Signed)
error 

## 2021-04-27 ENCOUNTER — Encounter: Payer: Self-pay | Admitting: Oncology

## 2021-04-27 NOTE — Progress Notes (Signed)
[facility]  [address] [place],  [place]  [address] [phone]  Clinic Day:  05/04/2021  Referring physician: Angelina Sheriff, MD  This document serves as a record of services personally performed by Dequincy Macarthur Critchley, MD. It was created on their behalf by [facility], a trained medical scribe. The creation of this record is based on the scribe's personal observations and the provider's statements to them.  HISTORY OF PRESENT ILLNESS:  The patient is a 70 y.o. male  with stage IIIB (T2 N1 M0) cholangiocarcinoma.  The plan was for him to start cisplatin/gemcitabine for palliation.  However, after thinking it over and discussing it with his family, the patient has ultimately decided not to pursue any palliative chemotherapy.  He comes in today for routine follow-up. Since his last visit, the patient has been doing okay.  He has mild discomfort in his abdomen, but denies having other significant GI symptoms.  He does take hydrocodone/acetaminophen for pain relief.  Of note, [employer] testing did not show where any form of targeted therapy could be used for his disease.    PHYSICAL EXAM:  Blood pressure (!) 143/71, pulse (!) 49, temperature 97.9 F (36.6 C), resp. rate 16, height 5\' 8"  (1.727 m), weight 161 lb 9.6 oz (73.3 kg), SpO2 99 %. Wt Readings from Last 3 Encounters:  05/04/21 161 lb 9.6 oz (73.3 kg)  04/08/21 162 lb (73.5 kg)  04/06/21 161 lb 12.8 oz (73.4 kg)   Body mass index is 24.57 kg/m. Performance status (ECOG): 1 - Symptomatic but completely ambulatory Physical Exam Constitutional:      Appearance: Normal appearance. He is not ill-appearing.  HENT:     Mouth/Throat:     Mouth: Mucous membranes are moist.     Pharynx: Oropharynx is clear. No oropharyngeal exudate or posterior oropharyngeal erythema.  Cardiovascular:     Rate and Rhythm: Normal rate and regular rhythm.     Heart sounds: No murmur heard.   No friction rub.  No gallop.  Pulmonary:     Effort: Pulmonary effort is normal. No respiratory distress.     Breath sounds: Normal breath sounds. No wheezing, rhonchi or rales.  Chest:  Breasts:    Right: No axillary adenopathy or supraclavicular adenopathy.     Left: No axillary adenopathy or supraclavicular adenopathy.  Abdominal:     General: Bowel sounds are normal. There is no distension.     Palpations: Abdomen is soft. There is no mass.     Tenderness: There is no abdominal tenderness.  Musculoskeletal:        General: No swelling.     Right lower leg: No edema.     Left lower leg: No edema.  Lymphadenopathy:     Cervical: No cervical adenopathy.     Upper Body:     Right upper body: No supraclavicular or axillary adenopathy.     Left upper body: No supraclavicular or axillary adenopathy.     Lower Body: No right inguinal adenopathy. No left inguinal adenopathy.  Skin:    General: Skin is warm.     Coloration: Skin is not jaundiced.     Findings: No lesion or rash.  Neurological:     General: No focal deficit present.     Mental Status: He is alert and oriented to person, place, and time. Mental status is at baseline.     Cranial Nerves: Cranial nerves are intact.  Psychiatric:  Mood and Affect: Mood normal.        Behavior: Behavior normal.        Thought Content: Thought content normal.   LABS:   CBC Latest Ref Rng & Units 05/04/2021 04/06/2021  WBC - 5.7 5.3  Hemoglobin 13.5 - 17.5 12.5(A) 12.5(A)  Hematocrit 41 - 53 37(A) 37(A)  Platelets 150 - 399 163 164   CMP Latest Ref Rng & Units 05/04/2021 04/06/2021 10/16/2020  Glucose 65 - 99 mg/dL - - -  BUN 4 - 21 21 17  -  Creatinine 0.6 - 1.3 1.3 1.1 -  Sodium 137 - 147 134(A) 134(A) -  Potassium 3.4 - 5.3 4.7 4.5 -  Chloride 99 - 108 96(A) 99 -  CO2 13 - 22 30(A) 28(A) -  Calcium 8.7 - 10.7 9.2 9.0 -  Alkaline Phos 25 - 125 97 88 -  AST 14 - 40 32 30 18  ALT 10 - 40 26 24 18     ASSESSMENT & PLAN:  A 70 y.o. male with  stage IIIB (T2 N1 M0) cholangiocarcinoma.   As mentioned previously, the patient does not wish to pursue palliative chemotherapy.  [employer] testing did not show where his tumor would be sensitive to any form of targeted therapy.  Although the patient clinically appears to be doing well, I will bring in Hospice so they may ascertain what his current baseline is.  He may be able to go by a few months without requiring their services.  Moving forward, the main goal is maximizing his daily quality of life for as long as possible.  Per his request, I will see him back in 2 months for repeat clinical assessment.  The patient understands all the plans discussed today and is in agreement with them.   I, Rita Ohara, am acting as scribe for Marice Potter, MD    I have reviewed this report as typed by the medical scribe, and it is complete and accurate.  Dequincy Macarthur Critchley, MD

## 2021-04-28 ENCOUNTER — Telehealth: Payer: Self-pay

## 2021-04-28 NOTE — Telephone Encounter (Signed)
Error

## 2021-04-28 NOTE — Telephone Encounter (Addendum)
Results are back per Dr Bobby Rumpf. Pt is scheduled to se Dr Bobby Rumpf on 05/04/2021. ----- Message from Marvia Pickles, PA-C sent at 04/26/2021  5:05 PM EDT ----- Regarding: RE: Testing report I don't see the results. I do not have a [employer] login. Maybe Tammy can help? ----- Message ----- From: Dairl Ponder, RN Sent: 04/26/2021   4:56 PM EDT To: Melodye Ped, NP, Marvia Pickles, PA-C Subject: RE: Testing report                             Results back yet? ----- Message ----- From: Melodye Ped, NP Sent: 04/21/2021   9:57 AM EDT To: Dairl Ponder, RN Subject: RE: Testing report                             I verified with him it is [employer] testing and it is not back yet.  ----- Message ----- From: Dairl Ponder, RN Sent: 04/21/2021   9:26 AM EDT To: Melodye Ped, NP Subject: RE: Testing report                             This is what he stated in his office note:   I will also have his tumor sent out for testing to see if his cancer would be amenable to some form of targeted therapy in the future.   ----- Message ----- From: Melodye Ped, NP Sent: 04/19/2021   5:07 PM EDT To: Dairl Ponder, RN Subject: RE: Testing report                             All I see is his mismatch repair is normal. Do you know what he had sent?  ----- Message ----- From: Dairl Ponder, RN Sent: 04/19/2021   4:14 PM EDT To: Melodye Ped, NP Subject: Testing report                                 Can you tell if his target testing has come back?

## 2021-04-29 ENCOUNTER — Encounter: Payer: Self-pay | Admitting: Oncology

## 2021-04-30 ENCOUNTER — Telehealth: Payer: Self-pay | Admitting: Oncology

## 2021-04-30 NOTE — Telephone Encounter (Signed)
Patient called to verify 7/5 Appt's

## 2021-04-30 NOTE — Telephone Encounter (Signed)
Adjusted Patient's 7/5 Appt Time Labs 11:15 am - Follow Up 11:45 am

## 2021-05-03 ENCOUNTER — Other Ambulatory Visit: Payer: Self-pay | Admitting: Cardiology

## 2021-05-03 DIAGNOSIS — I2510 Atherosclerotic heart disease of native coronary artery without angina pectoris: Secondary | ICD-10-CM

## 2021-05-03 DIAGNOSIS — I209 Angina pectoris, unspecified: Secondary | ICD-10-CM

## 2021-05-04 ENCOUNTER — Inpatient Hospital Stay: Payer: Medicare PPO | Attending: Oncology | Admitting: Oncology

## 2021-05-04 ENCOUNTER — Other Ambulatory Visit: Payer: Self-pay | Admitting: Oncology

## 2021-05-04 ENCOUNTER — Other Ambulatory Visit: Payer: Self-pay

## 2021-05-04 ENCOUNTER — Inpatient Hospital Stay: Payer: Medicare PPO

## 2021-05-04 ENCOUNTER — Other Ambulatory Visit: Payer: Self-pay | Admitting: Hematology and Oncology

## 2021-05-04 VITALS — BP 143/71 | HR 49 | Temp 97.9°F | Resp 16 | Ht 68.0 in | Wt 161.6 lb

## 2021-05-04 DIAGNOSIS — C221 Intrahepatic bile duct carcinoma: Secondary | ICD-10-CM

## 2021-05-04 DIAGNOSIS — C787 Secondary malignant neoplasm of liver and intrahepatic bile duct: Secondary | ICD-10-CM | POA: Diagnosis not present

## 2021-05-04 LAB — BASIC METABOLIC PANEL
BUN: 21 (ref 4–21)
CO2: 30 — AB (ref 13–22)
Chloride: 96 — AB (ref 99–108)
Creatinine: 1.3 (ref 0.6–1.3)
Glucose: 219
Potassium: 4.7 (ref 3.4–5.3)
Sodium: 134 — AB (ref 137–147)

## 2021-05-04 LAB — HEPATIC FUNCTION PANEL
ALT: 26 (ref 10–40)
AST: 32 (ref 14–40)
Alkaline Phosphatase: 97 (ref 25–125)
Bilirubin, Total: 0.6

## 2021-05-04 LAB — CBC: RBC: 4.03 (ref 3.87–5.11)

## 2021-05-04 LAB — CBC AND DIFFERENTIAL
HCT: 37 — AB (ref 41–53)
Hemoglobin: 12.5 — AB (ref 13.5–17.5)
Neutrophils Absolute: 4.22
Platelets: 163 (ref 150–399)
WBC: 5.7

## 2021-05-04 LAB — COMPREHENSIVE METABOLIC PANEL WITH GFR
Albumin: 4.3 (ref 3.5–5.0)
Calcium: 9.2 (ref 8.7–10.7)

## 2021-05-05 ENCOUNTER — Other Ambulatory Visit: Payer: Self-pay | Admitting: Hematology and Oncology

## 2021-05-05 MED ORDER — OXYCODONE-ACETAMINOPHEN 10-325 MG PO TABS: 1.0000 | ORAL_TABLET | ORAL | 0 refills | Status: AC | PRN

## 2021-05-13 ENCOUNTER — Telehealth: Payer: Self-pay | Admitting: Oncology

## 2021-05-13 NOTE — Telephone Encounter (Signed)
05/13/21 Patient left vm to cancel all appts

## 2021-05-25 ENCOUNTER — Other Ambulatory Visit: Payer: Medicare PPO

## 2021-05-25 ENCOUNTER — Ambulatory Visit: Payer: Medicare PPO | Admitting: Hematology and Oncology

## 2021-07-01 DIAGNOSIS — Z6824 Body mass index (BMI) 24.0-24.9, adult: Secondary | ICD-10-CM | POA: Diagnosis not present

## 2021-07-01 DIAGNOSIS — Z0000 Encounter for general adult medical examination without abnormal findings: Secondary | ICD-10-CM | POA: Diagnosis not present

## 2021-08-18 ENCOUNTER — Other Ambulatory Visit: Payer: Self-pay | Admitting: Cardiology

## 2021-08-24 DIAGNOSIS — H52223 Regular astigmatism, bilateral: Secondary | ICD-10-CM | POA: Diagnosis not present

## 2021-08-24 DIAGNOSIS — H524 Presbyopia: Secondary | ICD-10-CM | POA: Diagnosis not present

## 2021-08-24 DIAGNOSIS — H25813 Combined forms of age-related cataract, bilateral: Secondary | ICD-10-CM | POA: Diagnosis not present

## 2021-08-24 DIAGNOSIS — E119 Type 2 diabetes mellitus without complications: Secondary | ICD-10-CM | POA: Diagnosis not present

## 2021-08-24 DIAGNOSIS — I10 Essential (primary) hypertension: Secondary | ICD-10-CM | POA: Diagnosis not present

## 2021-08-24 DIAGNOSIS — H5203 Hypermetropia, bilateral: Secondary | ICD-10-CM | POA: Diagnosis not present

## 2021-08-24 DIAGNOSIS — Z7984 Long term (current) use of oral hypoglycemic drugs: Secondary | ICD-10-CM | POA: Diagnosis not present

## 2021-09-06 ENCOUNTER — Other Ambulatory Visit: Payer: Self-pay | Admitting: Cardiology

## 2021-10-31 ENCOUNTER — Other Ambulatory Visit: Payer: Self-pay | Admitting: Cardiology

## 2021-10-31 DIAGNOSIS — I209 Angina pectoris, unspecified: Secondary | ICD-10-CM

## 2021-10-31 DIAGNOSIS — I2510 Atherosclerotic heart disease of native coronary artery without angina pectoris: Secondary | ICD-10-CM

## 2021-11-02 ENCOUNTER — Encounter: Payer: Self-pay | Admitting: Oncology

## 2021-11-09 ENCOUNTER — Ambulatory Visit: Payer: Medicare PPO | Admitting: Cardiology

## 2022-01-29 DEATH — deceased
